# Patient Record
Sex: Male | Born: 1975 | Race: White | Hispanic: No | Marital: Single | Smoking: Never smoker
Health system: Southern US, Community
[De-identification: ages and names within clinical notes are randomized; demographics above are authoritative.]

## PROBLEM LIST (undated history)

## (undated) DIAGNOSIS — I10 Essential (primary) hypertension: Secondary | ICD-10-CM

## (undated) DIAGNOSIS — K5792 Diverticulitis of intestine, part unspecified, without perforation or abscess without bleeding: Secondary | ICD-10-CM

## (undated) DIAGNOSIS — I471 Supraventricular tachycardia: Secondary | ICD-10-CM

## (undated) HISTORY — DX: Supraventricular tachycardia: I47.1

## (undated) HISTORY — PX: ABDOMINAL SURGERY: SHX537

---

## 2011-04-17 DIAGNOSIS — I471 Supraventricular tachycardia, unspecified: Secondary | ICD-10-CM

## 2011-04-17 HISTORY — DX: Supraventricular tachycardia: I47.1

## 2011-04-17 HISTORY — DX: Supraventricular tachycardia, unspecified: I47.10

## 2013-12-16 ENCOUNTER — Emergency Department (HOSPITAL_COMMUNITY)
Admission: EM | Admit: 2013-12-16 | Discharge: 2013-12-16 | Disposition: A | Discharge status: Home / Self Care | Payer: Managed Care, Other (non HMO) | Attending: Emergency Medicine | Admitting: Emergency Medicine

## 2013-12-16 ENCOUNTER — Emergency Department (HOSPITAL_COMMUNITY): Payer: Managed Care, Other (non HMO)

## 2013-12-16 ENCOUNTER — Encounter (HOSPITAL_COMMUNITY): Payer: Self-pay | Admitting: Emergency Medicine

## 2013-12-16 DIAGNOSIS — Z79899 Other long term (current) drug therapy: Secondary | ICD-10-CM | POA: Insufficient documentation

## 2013-12-16 DIAGNOSIS — R109 Unspecified abdominal pain: Secondary | ICD-10-CM | POA: Insufficient documentation

## 2013-12-16 DIAGNOSIS — K2990 Gastroduodenitis, unspecified, without bleeding: Principal | ICD-10-CM

## 2013-12-16 DIAGNOSIS — K2970 Gastritis, unspecified, without bleeding: Secondary | ICD-10-CM | POA: Diagnosis present

## 2013-12-16 DIAGNOSIS — Z7982 Long term (current) use of aspirin: Secondary | ICD-10-CM | POA: Diagnosis not present

## 2013-12-16 HISTORY — DX: Diverticulitis of intestine, part unspecified, without perforation or abscess without bleeding: K57.92

## 2013-12-16 LAB — URINALYSIS, ROUTINE W REFLEX MICROSCOPIC
Bilirubin Urine: NEGATIVE
GLUCOSE, UA: NEGATIVE mg/dL
HGB URINE DIPSTICK: NEGATIVE
KETONES UR: NEGATIVE mg/dL
LEUKOCYTES UA: NEGATIVE
Nitrite: NEGATIVE
PH: 6.5 (ref 5.0–8.0)
Protein, ur: NEGATIVE mg/dL
Specific Gravity, Urine: 1.013 (ref 1.005–1.030)
Urobilinogen, UA: 0.2 mg/dL (ref 0.0–1.0)

## 2013-12-16 LAB — CBC WITH DIFFERENTIAL/PLATELET
Basophils Absolute: 0.1 10*3/uL (ref 0.0–0.1)
Basophils Relative: 1 % (ref 0–1)
EOS PCT: 3 % (ref 0–5)
Eosinophils Absolute: 0.2 10*3/uL (ref 0.0–0.7)
HCT: 44.3 % (ref 39.0–52.0)
Hemoglobin: 15.4 g/dL (ref 13.0–17.0)
LYMPHS ABS: 1.9 10*3/uL (ref 0.7–4.0)
LYMPHS PCT: 26 % (ref 12–46)
MCH: 31.1 pg (ref 26.0–34.0)
MCHC: 34.8 g/dL (ref 30.0–36.0)
MCV: 89.5 fL (ref 78.0–100.0)
Monocytes Absolute: 0.7 10*3/uL (ref 0.1–1.0)
Monocytes Relative: 9 % (ref 3–12)
NEUTROS ABS: 4.4 10*3/uL (ref 1.7–7.7)
Neutrophils Relative %: 61 % (ref 43–77)
PLATELETS: 399 10*3/uL (ref 150–400)
RBC: 4.95 MIL/uL (ref 4.22–5.81)
RDW: 12.3 % (ref 11.5–15.5)
WBC: 7.3 10*3/uL (ref 4.0–10.5)

## 2013-12-16 LAB — COMPREHENSIVE METABOLIC PANEL
ALK PHOS: 43 U/L (ref 39–117)
ALT: 40 U/L (ref 0–53)
AST: 27 U/L (ref 0–37)
Albumin: 4.3 g/dL (ref 3.5–5.2)
Anion gap: 14 (ref 5–15)
BUN: 14 mg/dL (ref 6–23)
CALCIUM: 10.3 mg/dL (ref 8.4–10.5)
CHLORIDE: 101 meq/L (ref 96–112)
CO2: 23 meq/L (ref 19–32)
Creatinine, Ser: 0.87 mg/dL (ref 0.50–1.35)
GFR calc Af Amer: >90 mL/min (ref >90)
Glucose, Bld: 99 mg/dL (ref 70–99)
POTASSIUM: 4.5 meq/L (ref 3.7–5.3)
SODIUM: 138 meq/L (ref 137–147)
Total Bilirubin: 0.8 mg/dL (ref 0.3–1.2)
Total Protein: 8 g/dL (ref 6.0–8.3)

## 2013-12-16 LAB — LIPASE, BLOOD: Lipase: 26 U/L (ref 11–59)

## 2013-12-16 MED ORDER — ONDANSETRON 4 MG PO TBDP
8.0000 mg | ORAL_TABLET | Freq: Once | ORAL | Status: AC
  Administered 2013-12-16: 8 mg via ORAL
  Filled 2013-12-16: qty 2

## 2013-12-16 MED ORDER — OMEPRAZOLE 20 MG PO CPDR: 20.0000 mg | DELAYED_RELEASE_CAPSULE | Freq: Every day | ORAL | Status: DC

## 2013-12-16 NOTE — ED Notes (Signed)
Patient transported to Ultrasound 

## 2013-12-16 NOTE — ED Provider Notes (Signed)
38 year-old male presents with intermittent right upper quadrant pain over the last several days, seems to be worse after eating, seems to be worse at night, recent trip town, recent diarrheal illness lasting one week. On exam the patient has right upper quadrant tenderness but no Murphy sign, no other abdominal tenderness or any concern. No surgical signs, normal heart and lung exam, no peripheral edema. Proceed with right upper quadrant pain workup including ultrasound and labs. Anticipate discharge if negative. The patient has been taking a proton pump inhibitor without improvement. He denies use of anti-inflammatory medications until today when he took some ibuprofen.  [place] neg for acute findings.  Results for orders placed during the hospital encounter of 12/16/13  CBC WITH DIFFERENTIAL      Result Value Ref Range   WBC 7.3  4.0 - 10.5 K/uL   RBC 4.95  4.22 - 5.81 MIL/uL   Hemoglobin 15.4  13.0 - 17.0 g/dL   HCT 44.3  39.0 - 52.0 %   MCV 89.5  78.0 - 100.0 fL   MCH 31.1  26.0 - 34.0 pg   MCHC 34.8  30.0 - 36.0 g/dL   RDW 12.3  11.5 - 15.5 %   Platelets 399  150 - 400 K/uL   Neutrophils Relative % 61  43 - 77 %   Neutro Abs 4.4  1.7 - 7.7 K/uL   Lymphocytes Relative 26  12 - 46 %   Lymphs Abs 1.9  0.7 - 4.0 K/uL   Monocytes Relative 9  3 - 12 %   Monocytes Absolute 0.7  0.1 - 1.0 K/uL   Eosinophils Relative 3  0 - 5 %   Eosinophils Absolute 0.2  0.0 - 0.7 K/uL   Basophils Relative 1  0 - 1 %   Basophils Absolute 0.1  0.0 - 0.1 K/uL  COMPREHENSIVE METABOLIC PANEL      Result Value Ref Range   Sodium 138  137 - 147 mEq/L   Potassium 4.5  3.7 - 5.3 mEq/L   Chloride 101  96 - 112 mEq/L   CO2 23  19 - 32 mEq/L   Glucose, Bld 99  70 - 99 mg/dL   BUN 14  6 - 23 mg/dL   Creatinine, Ser 0.87  0.50 - 1.35 mg/dL   Calcium 10.3  8.4 - 10.5 mg/dL   Total Protein 8.0  6.0 - 8.3 g/dL   Albumin 4.3  3.5 - 5.2 g/dL   AST 27  0 - 37 U/L   ALT 40  0 - 53 U/L   Alkaline Phosphatase 43  39 - 117  U/L   Total Bilirubin 0.8  0.3 - 1.2 mg/dL   GFR calc non Af Amer >90  >90 mL/min   GFR calc Af Amer >90  >90 mL/min   Anion gap 14  5 - 15  LIPASE, BLOOD      Result Value Ref Range   Lipase 26  11 - 59 U/L  URINALYSIS, ROUTINE W REFLEX MICROSCOPIC      Result Value Ref Range   Color, Urine YELLOW  YELLOW   APPearance CLEAR  CLEAR   Specific Gravity, Urine 1.013  1.005 - 1.030   pH 6.5  5.0 - 8.0   Glucose, UA NEGATIVE  NEGATIVE mg/dL   Hgb urine dipstick NEGATIVE  NEGATIVE   Bilirubin Urine NEGATIVE  NEGATIVE   Ketones, ur NEGATIVE  NEGATIVE mg/dL   Protein, ur NEGATIVE  NEGATIVE mg/dL  Urobilinogen, UA 0.2  0.0 - 1.0 mg/dL   Nitrite NEGATIVE  NEGATIVE   Leukocytes, UA NEGATIVE  NEGATIVE  H. PYLORI ANTIBODY, IGG      Result Value Ref Range   H Pylori IgG 0.85     US Abdomen Limited  12/16/2013   CLINICAL DATA:  Abdominal pain, right upper quadrant  EXAM: US ABDOMEN LIMITED - RIGHT UPPER QUADRANT  COMPARISON:  None.  FINDINGS: Gallbladder:  No gallstones or wall thickening visualized. No sonographic Murphy sign noted.  Common bile duct:  Diameter: 4 mm  Liver:  Dense liver with poor acoustic transmission, limiting assessment of the deep liver and deep biliary tree. Antegrade flow in the imaged portal venous system. No focal abnormality.  IMPRESSION: 1. Negative gallbladder. 2. Hepatic steatosis.   Electronically Signed   By: Jorje Guild M.D.   On: 12/16/2013 19:39    Pt will be started on PPI, stable for d/c.  Meds given in ED:  Medications  ondansetron (ZOFRAN-ODT) disintegrating tablet 8 mg (8 mg Oral Given 12/16/13 1712)    Discharge Medication List as of 12/16/2013  8:11 PM    START taking these medications   Details  omeprazole (PRILOSEC) 20 MG capsule Take 1 capsule (20 mg total) by mouth daily., Starting 12/16/2013, Until Discontinued, Print          I saw and evaluated the patient, reviewed the resident's note and I agree with the findings and  plan.    Johnna Acosta, MD 12/17/13 [phone]

## 2013-12-16 NOTE — ED Provider Notes (Signed)
CSN: [id number]     Arrival date & time 12/16/13  1700 History   First MD Initiated Contact with Patient 12/16/13 1842     Chief Complaint  Patient presents with  . Abdominal Pain     (Consider location/radiation/quality/duration/timing/severity/associated sxs/prior Treatment) Patient is a 38 y.o. male presenting with abdominal pain. The history is provided by the patient.  Abdominal Pain Pain location:  RUQ Pain quality: gnawing   Pain radiates to:  Epigastric region Pain severity:  Moderate Onset quality:  Gradual Duration:  4 days Timing:  Intermittent Progression:  Worsening Chronicity:  New Context comment:  Patient was recently in [place] with friends where he was dirnking EtOH moderately about 4-5 days ago Relieved by:  Nothing Exacerbated by: noted 1-2 hours after meals. Ineffective treatments: reports having reflux in the past 2 months but this is different, and has tried Prilosec for these symptoms. Associated symptoms: nausea   Associated symptoms: no anorexia, no belching, no chest pain, no chills, no constipation, no cough, no diarrhea, no dysuria, no fatigue, no fever, no flatus, no hematemesis, no melena, no shortness of breath, no sore throat, no vaginal bleeding and no vaginal discharge   Nausea:    Severity:  Moderate   Onset quality:  Gradual   Duration:  2 days   Timing:  Intermittent (with pain) Risk factors: no aspirin use, not elderly, has not had multiple surgeries, no NSAID use, not obese, not pregnant and no recent hospitalization     Past Medical History  Diagnosis Date  . Diverticulitis    History reviewed. No pertinent past surgical history. No family history on file. History  Substance Use Topics  . Smoking status: Never Smoker   . Smokeless tobacco: Not on file  . Alcohol Use: Yes    Review of Systems  Constitutional: Negative for fever, chills and fatigue.  HENT: Negative for sore throat.   Respiratory: Negative for cough and shortness  of breath.   Cardiovascular: Negative for chest pain.  Gastrointestinal: Positive for nausea and abdominal pain. Negative for diarrhea, constipation, melena, anorexia, flatus and hematemesis.  Genitourinary: Negative for dysuria, vaginal bleeding and vaginal discharge.  All other systems reviewed and are negative.     Allergies  Sulfa antibiotics  Home Medications   Prior to Admission medications   Medication Sig Start Date End Date Taking? Authorizing Provider  aspirin 81 MG tablet Take 81 mg by mouth daily.   Yes Historical Provider, MD  calcium carbonate (TUMS - DOSED IN MG ELEMENTAL CALCIUM) 500 MG chewable tablet Chew 1 tablet by mouth daily as needed for indigestion or heartburn.   Yes Historical Provider, MD  docusate sodium (COLACE) 100 MG capsule Take 100 mg by mouth 2 (two) times daily.   Yes Historical Provider, MD  lisinopril (PRINIVIL,ZESTRIL) 30 MG tablet Take 30 mg by mouth daily.   Yes Historical Provider, MD  metoprolol (LOPRESSOR) 100 MG tablet Take 100 mg by mouth 2 (two) times daily.   Yes Historical Provider, MD  Multiple Vitamin (MULTIVITAMIN WITH MINERALS) TABS tablet Take 1 tablet by mouth daily.   Yes Historical Provider, MD  simethicone (MYLICON) 80 MG chewable tablet Chew 80 mg by mouth every 6 (six) hours as needed for flatulence.   Yes Historical Provider, MD  omeprazole (PRILOSEC) 20 MG capsule Take 1 capsule (20 mg total) by mouth daily. 12/16/13   Kelby Aline, MD   BP 131/77  Pulse 73  Temp(Src) 98.2 F (36.8 C) (Oral)  Resp 14  Ht 6' (1.829 m)  Wt 245 lb (111.131 kg)  BMI 33.22 kg/m2  SpO2 100% Physical Exam  Nursing note and vitals reviewed. Constitutional: He is oriented to person, place, and time. He appears well-developed and well-nourished. No distress.  HENT:  Head: Normocephalic and atraumatic.  Eyes: Conjunctivae and EOM are normal. Right eye exhibits no discharge. Left eye exhibits no discharge.  Neck: Normal range of motion. Neck  supple. No tracheal deviation present.  Cardiovascular: Normal rate, regular rhythm and normal heart sounds.  Exam reveals no friction rub.   No murmur heard. Pulmonary/Chest: Effort normal and breath sounds normal. No stridor. No respiratory distress. He has no wheezes. He has no rales. He exhibits no tenderness.  Abdominal: Soft. He exhibits no distension. There is no splenomegaly or hepatomegaly. There is tenderness in the right upper quadrant and epigastric area. There is positive Murphy's sign. There is no rebound and no guarding.  Neurological: He is alert and oriented to person, place, and time.  Skin: Skin is warm.  Psychiatric: He has a normal mood and affect.    ED Course  Procedures (including critical care time) Labs Review Labs Reviewed  CBC WITH DIFFERENTIAL  COMPREHENSIVE METABOLIC PANEL  LIPASE, BLOOD  URINALYSIS, ROUTINE W REFLEX MICROSCOPIC  H. PYLORI ANTIBODY, IGG    Imaging Review US Abdomen Limited  12/16/2013   CLINICAL DATA:  Abdominal pain, right upper quadrant  EXAM: US ABDOMEN LIMITED - RIGHT UPPER QUADRANT  COMPARISON:  None.  FINDINGS: Gallbladder:  No gallstones or wall thickening visualized. No sonographic Murphy sign noted.  Common bile duct:  Diameter: 4 mm  Liver:  Dense liver with poor acoustic transmission, limiting assessment of the deep liver and deep biliary tree. Antegrade flow in the imaged portal venous system. No focal abnormality.  IMPRESSION: 1. Negative gallbladder. 2. Hepatic steatosis.   Electronically Signed   By: Jorje Guild M.D.   On: 12/16/2013 19:39     EKG Interpretation None      MDM   Final diagnoses:  Gastritis    Pt with a history of perforated diverticulitis s/p partial colectomy 3 years ago presents with RUQ abdominal pain. + nausea, but no vomiting. No diarrhea, change in stool color. He did have diarrhea about a weeks ago, which has resolved. No dysuria. Has a remote history of kidney stones but this is not similar.  Recently was drinking alcohol socially. AFVSS. NAD. RUQ ttp, but no murphy sign. No peritonitis. No CVA ttp. Lipase neg - doubt acute pancreatitis. LFTs wnls -doubt acute hepatitis. RUQ neg for acute cholecystitis or cholelithiasis. Likely GERD. H pylori sent off. Start omeprazole 20 mg QD, and will need to follow up with PCP regarding H pylori results as he will need to be treated if +. He understands and will fu as noted. Refrain from EtOH use and NSAID use until cleared by his primary doctor. Strong return precautions given for worsening symptoms or any other alarming or concerning symptoms or issues. The patient was in agreement with the treatment plan and I answered all of their questions. The patient was stable for dc. At dc, the patient ambulated without difficulty, was moving all four extremities, symptoms improved, NAD. and AOx4 Care discussed with my attending, Dr. Noemi Chapel. If performed and available, imaging studies and labs reviewed.     Kelby Aline, MD 12/16/13 2021

## 2013-12-16 NOTE — ED Notes (Signed)
Pt. Stated, "I'm okay right now, I don't need anything for pain."

## 2013-12-16 NOTE — ED Notes (Signed)
The pt is c/o generalized abd pain for 3-4 days with sl nausea.  Hx of diverticulitis

## 2013-12-16 NOTE — Discharge Instructions (Signed)

## 2013-12-17 LAB — H. PYLORI ANTIBODY, IGG: H Pylori IgG: 0.85 {ISR}

## 2013-12-17 NOTE — ED Provider Notes (Signed)
I saw and evaluated the patient, reviewed the resident's note and I agree with the findings and plan.  Please see my separate note regarding my evaluation of the patient.  Clinical Impression:  RUQ abdominal pain  Johnna Acosta, MD 12/17/13 1428

## 2015-05-10 ENCOUNTER — Ambulatory Visit (INDEPENDENT_AMBULATORY_CARE_PROVIDER_SITE_OTHER): Payer: Managed Care, Other (non HMO) | Admitting: Cardiology

## 2015-05-10 ENCOUNTER — Encounter: Payer: Self-pay | Admitting: Cardiology

## 2015-05-10 VITALS — BP 110/76 | HR 70 | Ht 73.0 in | Wt 241.4 lb

## 2015-05-10 DIAGNOSIS — I471 Supraventricular tachycardia, unspecified: Secondary | ICD-10-CM | POA: Insufficient documentation

## 2015-05-10 DIAGNOSIS — I10 Essential (primary) hypertension: Secondary | ICD-10-CM

## 2015-05-10 NOTE — Progress Notes (Signed)
Cardiology Office Note   Date:  05/10/2015   ID:  Ryan Williamson, DOB February 28, 1976, MRN [id number]  PCP:  No PCP Per Patient  Cardiologist:   Minus Breeding, MD   Chief Complaint  Patient presents with  . SVT      History of Present Illness: Ryan Williamson is a 40 y.o. male who presents for evaluation of SVT. He is moving here from [place]. I was able to review records online from the [facility]. He had a history of SVT in 2013. 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 He was treated with beta blockers and there was a discussion of ablation. However, he said that his symptoms resolved almost completely with beta blocker. He's moving down here and wants to reestablish care. He is also on medications for blood pressure control. He's wanting to start to get into more of an exercise regimen and started doing a lot more walking up to 5 miles per day. The patient denies any new symptoms such as chest discomfort, neck or arm discomfort. There has been no new shortness of breath, PND or orthopnea. There have been no reported palpitations, presyncope or syncope.  Past Medical History  Diagnosis Date  . Diverticulitis     No past surgical history on file.   Current Outpatient Prescriptions  Medication Sig Dispense Refill  . aspirin 81 MG tablet Take 81 mg by mouth daily.    . calcium carbonate (TUMS - DOSED IN MG ELEMENTAL CALCIUM) 500 MG chewable tablet Chew 1 tablet by mouth daily as needed for indigestion or heartburn.    . docusate sodium (COLACE) 100 MG capsule Take 100 mg by mouth 2 (two) times daily.    Marland Kitchen lisinopril (PRINIVIL,ZESTRIL) 30 MG tablet Take 30 mg by mouth daily.    . metoprolol (LOPRESSOR) 100 MG tablet Take 100 mg by mouth 2 (two) times daily.    . Multiple Vitamin (MULTIVITAMIN WITH MINERALS) TABS tablet Take 1 tablet by mouth daily.    Marland Kitchen omeprazole (PRILOSEC) 20 MG capsule Take 1 capsule (20 mg total) by mouth daily. (Patient taking differently:  Take 20 mg by mouth as needed. ) 30 capsule 0  . omeprazole (PRILOSEC) 20 MG capsule Take 20 mg by mouth as needed.     No current facility-administered medications for this visit.    Allergies:   Sulfa antibiotics    Social History:  The patient  reports that he has never smoked. He does not have any smokeless tobacco history on file. He reports that he drinks alcohol.   Family History:  The patient's HTN in his mother   ROS:  Please see the history of present illness.   Otherwise, review of systems are positive for none.   All other systems are reviewed and negative.    PHYSICAL EXAM: VS:  BP 110/76 mmHg  Pulse 70  Ht 6\' 1"  (1.854 m)  Wt 241 lb 6.4 oz (109.498 kg)  BMI 31.86 kg/m2 , BMI Body mass index is 31.86 kg/(m^2). GENERAL:  Well appearing HEENT:  Pupils equal round and reactive, fundi not visualized, oral mucosa unremarkable NECK:  No jugular venous distention, waveform within normal limits, carotid upstroke brisk and symmetric, no bruits, no thyromegaly LYMPHATICS:  No cervical, inguinal adenopathy LUNGS:  Clear to auscultation bilaterally BACK:  No CVA tenderness CHEST:  Unremarkable HEART:  PMI not displaced or sustained,S1 and S2 within normal limits, no S3, no S4, no clicks, no rubs, no murmurs ABD:  Flat, positive bowel sounds normal in frequency in pitch, no bruits, no rebound, no guarding, no midline pulsatile mass, no hepatomegaly, no splenomegaly EXT:  2 plus pulses throughout, no edema, no cyanosis no clubbing SKIN:  No rashes no nodules NEURO:  Cranial nerves II through XII grossly intact, motor grossly intact throughout PSYCH:  Cognitively intact, oriented to person place and time    EKG:  EKG is ordered today. The ekg ordered today demonstrates sinus rhythm, rate 74, axis within normal limits, intervals within normal limits, no acute ST-T wave changes.   Recent Labs: No results found for requested labs within last 365 days.    Lipid Panel No  results found for: CHOL, TRIG, HDL, CHOLHDL, VLDL, LDLCALC, LDLDIRECT    Wt Readings from Last 3 Encounters:  05/10/15 241 lb 6.4 oz (109.498 kg)  12/16/13 245 lb (111.131 kg)      Other studies Reviewed: Additional studies/ records that were reviewed today include: [facility] records.. Review of the above records demonstrates:  Please see elsewhere in the note.     ASSESSMENT AND PLAN:  SVT:  He is well controlled on beta blockers. He tried to switch to once daily instead of twice daily Toprol-XL. He had a few more palpitations so he can continue the regimen as currently listed. I don't see any indication for ablation or change in therapy as he is well controlled and has no problems taking the beta blocker.  WEIGHT/PRIMARY PREVENTION:  I did discuss with him the fact that risks benefits would not suggest any need for him to take aspirin. We talked at great length about diet and exercise for weight control and he was given very specific suggestions for intervention. He is motivated.  HTN:  His blood pressure is well controlled. As he loses weight and improves his overall healthy eating and active living he will likely be overcome down the lisinopril and we discussed this.   Current medicines are reviewed at length with the patient today.  The patient does not have concerns regarding medicines.  The following changes have been made:  no change  Labs/ tests ordered today include: None  No orders of the defined types were placed in this encounter.     Disposition:   FU with me in one year.     Signed, Minus Breeding, MD  05/10/2015 6:27 PM    [place]

## 2015-05-23 ENCOUNTER — Other Ambulatory Visit: Payer: Self-pay | Admitting: Family Medicine

## 2015-05-23 DIAGNOSIS — R1032 Left lower quadrant pain: Secondary | ICD-10-CM

## 2015-05-27 ENCOUNTER — Ambulatory Visit
Admission: RE | Admit: 2015-05-27 | Discharge: 2015-05-27 | Disposition: A | Discharge status: Home / Self Care | Payer: Managed Care, Other (non HMO) | Source: Ambulatory Visit | Attending: Family Medicine | Admitting: Family Medicine

## 2015-05-27 ENCOUNTER — Other Ambulatory Visit: Payer: Self-pay | Admitting: Family Medicine

## 2015-05-27 ENCOUNTER — Encounter (INDEPENDENT_AMBULATORY_CARE_PROVIDER_SITE_OTHER): Payer: Self-pay

## 2015-05-27 DIAGNOSIS — R1032 Left lower quadrant pain: Secondary | ICD-10-CM

## 2016-01-04 ENCOUNTER — Ambulatory Visit: Payer: Self-pay | Admitting: General Surgery

## 2016-01-04 NOTE — H&P (Signed)
Ryan Williamson 01/04/2016 11:22 AM Location: [facility] Patient #: [id number] DOB: 1976/03/29 Married / Language: [name] / Race: White Male  History of Present Illness [name] MD; 01/04/2016 11:57 AM) The patient is a 40 year old male.   Note:He presents today for preoperative visit regarding left inguinal hernia repair with mesh and removal of 2 small cysts on his scrotum. He is been managing a farm. He feels he will be able to have the surgery in November.  Allergies Elbert Ewings, CMA; 01/04/2016 11:22 AM) Sulfabenzamide *CHEMICALS* Hives.  Medication History Elbert Ewings, CMA; 01/04/2016 11:22 AM) Aspirin (81MG  Tablet, Oral daily) Active. Lisinopril (30MG  Tablet, Oral) Active. Metoprolol Succinate ER (100MG  Tablet ER 24HR, Oral) Active. Medications Reconciled    Vitals Elbert Ewings CMA; 01/04/2016 11:22 AM) 01/04/2016 11:22 AM Weight: 238 lb Height: 72in Body Surface Area: 2.29 m Body Mass Index: 32.28 kg/m  Temp.: 98.56F(Temporal)  Pulse: 72 (Regular)  BP: 128/82 (Sitting, Left Arm, Standard)      Physical Exam [name] MD; 01/04/2016 11:58 AM)  The physical exam findings are as follows: Note:General-overweight male in no acute distress.  GU-reducible left inguinal bulge. Small cystic lesion on left and right hemiscrotal area.    Assessment & Plan [name] MD; 01/04/2016 11:57 AM)  REDUCIBLE LEFT INGUINAL HERNIA (K40.90) Impression: Unchanged.  Plan: Schedule left inguinal hernia. Mesh and removal of small cysts and scrotum. I have explained the procedure, risks, and aftercare of inguinal hernia repair. Risks include but are not limited to bleeding, infection, wound problems, anesthesia, recurrence, bladder or intestine injury, urinary retention, testicular dysfunction, chronic pain, mesh problems. He seems to understand and agrees to proceed. e went over the procedure and risks again.  Jackolyn Confer,  MD

## 2016-05-01 ENCOUNTER — Ambulatory Visit (INDEPENDENT_AMBULATORY_CARE_PROVIDER_SITE_OTHER): Payer: BLUE CROSS/BLUE SHIELD | Admitting: Physician Assistant

## 2016-05-01 ENCOUNTER — Encounter: Payer: Self-pay | Admitting: Physician Assistant

## 2016-05-01 ENCOUNTER — Telehealth: Payer: Self-pay | Admitting: Cardiology

## 2016-05-01 VITALS — BP 116/75 | HR 75 | Ht 73.0 in | Wt 241.4 lb

## 2016-05-01 DIAGNOSIS — I471 Supraventricular tachycardia: Secondary | ICD-10-CM

## 2016-05-01 DIAGNOSIS — I491 Atrial premature depolarization: Secondary | ICD-10-CM | POA: Diagnosis not present

## 2016-05-01 NOTE — Progress Notes (Signed)
Cardiology Office Note   Date:  05/01/2016   ID:  Ryan Williamson, DOB Feb 03, 1976, MRN [id number]  PCP:  No PCP Per Patient  Cardiologist:  Dr. Percival Spanish 01/08/2016  Rosaria Ferries, PA-C   Chief Complaint  Patient presents with  . Palpitations    History of Present Illness: Ryan Williamson is a 41 y.o. male with a history of SVT  Ryan Williamson presents for cardiology evaluation.  Until today, he was doing fine. His wife is [redacted] weeks pregnant.  He had [place] yesterday, [place] food has been a trigger in the past. He has also used Afrin 12 hr nasal spray for sinus congestion. He has had problems with pseudafed in the past, but did not use it recently.   Today, when he woke up, he was having palpitations. He took his usual Toprol XL 100 mg at 6 am. He was having frequent palps so took Lopressor 100 mg at 8 am. The palpitations have continued to happen, every minute or 2, all day long. The Lopressor did help the frequency, but they have not stopped all day.   The palps give him a sensation in his neck, a pause and then a harder beat. They do not give him chest pain or SOB. He has not had long runs.   He is concerned that he will be sick and not be able to help care for his son. He is worried that the ectopy is harmful to him. He is under stress because of everything going on with the pregnancy.    Past Medical History:  Diagnosis Date  . Diverticulitis   . SVT (supraventricular tachycardia) ([place]) 2013   Diagnosed at the [facility]    No past surgical history on file.  Current Outpatient Prescriptions  Medication Sig Dispense Refill  . aspirin 81 MG tablet Take 81 mg by mouth daily.    . calcium carbonate (TUMS - DOSED IN MG ELEMENTAL CALCIUM) 500 MG chewable tablet Chew 1 tablet by mouth daily as needed for indigestion or heartburn.    . docusate sodium (COLACE) 100 MG capsule Take 100 mg by mouth 2 (two) times daily.    Marland Kitchen lisinopril (PRINIVIL,ZESTRIL) 30 MG  tablet Take 30 mg by mouth daily.    . metoprolol (LOPRESSOR) 100 MG tablet Take 100 mg by mouth 2 (two) times daily as needed (palpatitions).     . metoprolol succinate (TOPROL-XL) 100 MG 24 hr tablet Take 1 mg by mouth 2 (two) times daily.    . Multiple Vitamin (MULTIVITAMIN WITH MINERALS) TABS tablet Take 1 tablet by mouth daily.    Marland Kitchen omeprazole (PRILOSEC) 20 MG capsule Take 20 mg by mouth as needed.     No current facility-administered medications for this visit.     Allergies:   Sulfa antibiotics    Social History:  The patient  reports that he has never smoked. He does not have any smokeless tobacco history on file. He reports that he drinks alcohol.   Family History:  The patient's family history is not on file.    ROS:  Please see the history of present illness. All other systems are reviewed and negative.    PHYSICAL EXAM: VS:  BP 116/75   Pulse 75   Ht 6\' 1"  (1.854 m)   Wt 241 lb 6.4 oz (109.5 kg)   BMI 31.85 kg/m  , BMI Body mass index is 31.85 kg/m. GEN: Well nourished, well developed, male in no acute  distress  HEENT: normal for age  Neck: no JVD, no carotid bruit, no masses Cardiac: RRR; no murmur, no rubs, or gallops Respiratory:  clear to auscultation bilaterally, normal work of breathing GI: soft, nontender, nondistended, + BS MS: no deformity or atrophy; no edema; distal pulses are 2+ in all 4 extremities   Skin: warm and dry, no rash Neuro:  Strength and sensation are intact Psych: euthymic mood, full affect   EKG:  EKG is ordered today. The ekg ordered today demonstrates SR, frequent PACs, 2-3/minute.   Recent Labs: No results found for requested labs within last 8760 hours.    Lipid Panel No results found for: CHOL, TRIG, HDL, CHOLHDL, VLDL, LDLCALC, LDLDIRECT   Wt Readings from Last 3 Encounters:  05/01/16 241 lb 6.4 oz (109.5 kg)  05/10/15 241 lb 6.4 oz (109.5 kg)  12/16/13 245 lb (111.1 kg)     Other studies Reviewed: Additional  studies/ records that were reviewed today include: office notes.  ASSESSMENT AND PLAN:  1.  SVT: Pt is not having any of that now. He is to continue the Toprol XL.  2. PACs: They are frequent and concern him, but are not causing true symptoms. If they do not get better in a day or so, we can start Cardizem CD 120 mg qd in addition to the Toprol XL. He would have to cut the prn Lopressor in half and be very careful about using it. If the PACs resolve, no med change needed.    Current medicines are reviewed at length with the patient today.  The patient does not have concerns regarding medicines.  The following changes have been made:  no change at this time. Add Cardizem CD 120 mg qd prn  Labs/ tests ordered today include:   No orders of the defined types were placed in this encounter.    Disposition:   FU with Dr Percival Spanish  Signed, Rosaria Ferries, PA-C  05/01/2016 2:57 PM    [facility] Phone: [phone]; Fax: [phone]  This note was written with the assistance of speech recognition software. Please excuse any transcriptional errors.

## 2016-05-01 NOTE — Telephone Encounter (Signed)
Acknowledged.

## 2016-05-01 NOTE — Telephone Encounter (Signed)
New Message  Pt is scheduled to see PA/APP-Rhonda Barrett today @ 230 pm.

## 2016-05-01 NOTE — Patient Instructions (Signed)
Your physician recommends that you continue on your current medications as directed. Please refer to the Current Medication list given to you today.  Please contact our office if you symptoms persist  Your physician wants you to follow-up in: [place] with Dr. Percival Spanish. You will receive a reminder letter in the mail two months in advance. If you don't receive a letter, please call our office to schedule the follow-up appointment.

## 2016-11-12 ENCOUNTER — Encounter: Payer: Self-pay | Admitting: Podiatry

## 2016-11-12 ENCOUNTER — Ambulatory Visit (INDEPENDENT_AMBULATORY_CARE_PROVIDER_SITE_OTHER): Payer: BLUE CROSS/BLUE SHIELD | Admitting: Podiatry

## 2016-11-12 VITALS — BP 135/78 | HR 90

## 2016-11-12 DIAGNOSIS — B351 Tinea unguium: Secondary | ICD-10-CM | POA: Diagnosis not present

## 2016-11-12 DIAGNOSIS — L603 Nail dystrophy: Secondary | ICD-10-CM | POA: Diagnosis not present

## 2016-11-12 NOTE — Progress Notes (Signed)
   Subjective:    Patient ID: Giannis Corpuz, male    DOB: 27-May-1975, 41 y.o.   MRN: [id number]  HPI 41 year old male presents the office today for concerns of her left toenail injury. He states that he dropped a piece of firewood on his left toenail back in March or April. He states that as the nail has grown out is starting somewhat ingrown. He states that when he first had the injury the nail to crack and started to become somewhat brittle. Denies any swelling or redness or any drainage. He has some occasional discomfort in the toenail. He has a history of ingrown toenails right big toe that had been taken out.   Review of Systems  All other systems reviewed and are negative.      Objective:   Physical Exam General: AAO x3, NAD  Dermatological: To the left hallux toenail is incurvation of both the medial and lateral aspects. There does appear to be horizontal ridge within the nail on the distal one third. There is no edema, erythema, drainage or pus. There is mild discoloration with yellow discoloration as also to be the toenail.  There are no open sores, no preulcerative lesions, no rash or signs of infection present.  Vascular: Dorsalis Pedis artery and Posterior Tibial artery pedal pulses are 2/4 bilateral with immedate capillary fill time. There is no pain with calf compression, swelling, warmth, erythema.   Neruologic: Grossly intact via light touch bilateral.  Patellar and Achilles deep tendon reflexes 2+ bilateral. No Babinski or clonus noted bilateral.   Musculoskeletal: No gross boney pedal deformities bilateral. No pain, crepitus, or limitation noted with foot and ankle range of motion bilateral. Muscular strength 5/5 in all groups tested bilateral.  Gait: Unassisted, Nonantalgic.      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 He does not want have this done today therefore I did to breathe symptomatic portion ingrown toenail the any complications. I did send the nail also for cultures/pathology to [facility] labs. Discussed that over the nail will grow out uneventfully but is a chance he may lose part or all the toenail. Continue to keep it straight across. Monitor for any clinical signs or symptoms of infection and directed to call the office immediately should any occur or go to the ER.  Celesta Gentile, DPM

## 2016-11-29 ENCOUNTER — Telehealth: Payer: Self-pay

## 2016-11-29 MED ORDER — UREA 40 % EX CREA: 1.0000 g | TOPICAL_CREAM | Freq: Two times a day (BID) | CUTANEOUS | 3 refills | Status: DC

## 2016-11-29 NOTE — Telephone Encounter (Signed)
Spoke with patient about negative nail culture results, advised him of Rx for Urea.  Sent Rx to [facility]

## 2016-11-29 NOTE — Telephone Encounter (Signed)
-----   Message from Trula Slade, DPM sent at 11/29/2016  7:15 AM EDT ----- Negative for fungus. I would do a urea cream for the nail. Can either get it from the office or please order through [facility]. Please let her know. Thanks.

## 2016-12-24 ENCOUNTER — Ambulatory Visit: Payer: BLUE CROSS/BLUE SHIELD | Admitting: Podiatry

## 2017-01-03 ENCOUNTER — Ambulatory Visit (INDEPENDENT_AMBULATORY_CARE_PROVIDER_SITE_OTHER): Payer: BLUE CROSS/BLUE SHIELD | Admitting: Podiatry

## 2017-01-03 ENCOUNTER — Encounter: Payer: Self-pay | Admitting: Podiatry

## 2017-01-03 DIAGNOSIS — L600 Ingrowing nail: Secondary | ICD-10-CM | POA: Diagnosis not present

## 2017-01-03 DIAGNOSIS — L603 Nail dystrophy: Secondary | ICD-10-CM

## 2017-01-04 NOTE — Progress Notes (Signed)
Subjective: Ryan Williamson presents the office they for follow-up evaluation of his left big toenail onychodystrophy, ingrown toenail. States the areas doing much better since I last saw him. He is been using the urea cream intermittently been on a continual basis. He does with a stent top of the toe in order to help the graft better and help prevent any future issues. He has no new concerns. Denies any systemic complaints such as fevers, chills, nausea, vomiting. No acute changes since last appointment, and no other complaints at this time.   Objective: AAO x3, NAD DP/PT pulses palpable bilaterally, CRT less than 3 seconds Left hallux toenail has some mild incurvation of both medial and lateral aspects of the nail corners mostly distally. There iscontinued tenderness palpation the nail borders there is no edema, erythema, drainage or pus any clinical signs of infection. There does appear to be a horizontal ridge in the nail although distally and appears to be growing out. The proximal nail borders be clear but the distal portion appears have some discoloration.  No open lesions or pre-ulcerative lesions.  No pain with calf compression, swelling, warmth, erythema  Assessment: Ingrown toenail, onychodystrophy with improvement  Plan: -All treatment options discussed with the patient including all alternatives, risks, complications.  -I did debride the left hallux toenail today without any complications or bleeding in the toenail out. Continue the urea cream daily. Monitor for signs or symptoms of infection. Again which stop any nail procedure. -Patient encouraged to call the office with any questions, concerns, change in symptoms.   Celesta Gentile, DPM

## 2017-03-05 ENCOUNTER — Ambulatory Visit: Payer: BLUE CROSS/BLUE SHIELD | Admitting: Podiatry

## 2018-04-28 ENCOUNTER — Other Ambulatory Visit: Payer: Self-pay | Admitting: Family Medicine

## 2018-04-28 DIAGNOSIS — R1011 Right upper quadrant pain: Secondary | ICD-10-CM

## 2018-05-07 ENCOUNTER — Other Ambulatory Visit: Payer: BLUE CROSS/BLUE SHIELD

## 2018-05-09 ENCOUNTER — Ambulatory Visit
Admission: RE | Admit: 2018-05-09 | Discharge: 2018-05-09 | Disposition: A | Discharge status: Home / Self Care | Payer: BLUE CROSS/BLUE SHIELD | Source: Ambulatory Visit | Attending: Family Medicine | Admitting: Family Medicine

## 2018-05-09 DIAGNOSIS — R1011 Right upper quadrant pain: Secondary | ICD-10-CM

## 2018-05-19 ENCOUNTER — Other Ambulatory Visit: Payer: Self-pay | Admitting: Gastroenterology

## 2018-05-19 DIAGNOSIS — R1011 Right upper quadrant pain: Secondary | ICD-10-CM

## 2018-05-21 ENCOUNTER — Other Ambulatory Visit: Payer: Self-pay | Admitting: Gastroenterology

## 2018-05-27 ENCOUNTER — Other Ambulatory Visit: Payer: BLUE CROSS/BLUE SHIELD

## 2018-06-10 ENCOUNTER — Ambulatory Visit
Admission: RE | Admit: 2018-06-10 | Discharge: 2018-06-10 | Disposition: A | Discharge status: Home / Self Care | Payer: BLUE CROSS/BLUE SHIELD | Source: Ambulatory Visit | Attending: Gastroenterology | Admitting: Gastroenterology

## 2018-06-10 ENCOUNTER — Encounter: Payer: Self-pay | Admitting: Radiology

## 2018-06-10 DIAGNOSIS — R1011 Right upper quadrant pain: Secondary | ICD-10-CM

## 2018-06-10 MED ORDER — IOPAMIDOL (ISOVUE-300) INJECTION 61%
100.0000 mL | Freq: Once | INTRAVENOUS | Status: AC | PRN
  Administered 2018-06-10: 100 mL via INTRAVENOUS

## 2019-04-28 NOTE — Progress Notes (Signed)
Virtual Visit via Video Note   This visit type was conducted due to national recommendations for restrictions regarding the COVID-19 Pandemic (e.g. social distancing) in an effort to limit this patient's exposure and mitigate transmission in our community.  Due to his co-morbid illnesses, this patient is at least at moderate risk for complications without adequate follow up.  This format is felt to be most appropriate for this patient at this time.  All issues noted in this document were discussed and addressed.  A limited physical exam was performed with this format.  Please refer to the patient's chart for his consent to telehealth for [facility].   Date:  04/29/2019   ID:  Ryan Williamson, DOB March 28, 1976, MRN [id number]  Patient Location: Home Provider Location: Home  PCP:  London Pepper, MD  Cardiologist:  Minus Breeding, MD  Electrophysiologist:  None   Evaluation Performed:  Follow-Up Visit  Chief Complaint:  Palpitations  History of Present Illness:    Ryan Williamson is a 44 y.o. male who presents for follow up of palpitations.  He was recently treated with metoprolol.  He had Cardizem PRN added.    It has been a couple of years since we last saw him.  However, he has had some more palpitations.  He thought he was doing better when he took metoprolol tartrate but he has been getting succinate.  He has some increased palpitations.  His blood pressure has been fluctuating too.  He had a cough and they switched him to Cozaar but I do not know that this controlled his blood pressure and his cough is not necessarily changed so he is back on lisinopril.  His blood pressure is running a little bit high.  However, he has gained weight with Covid.  He has been under stress with Covid.  He does have a 90-year-old now.    The patient denies any new symptoms such as chest discomfort, neck or arm discomfort. There has been no new shortness of breath, PND or orthopnea. There has been no  reported presyncope or syncope.   The patient does not have symptoms concerning for COVID-19 infection (fever, chills, cough, or new shortness of breath).    Past Medical History:  Diagnosis Date  . Diverticulitis   . SVT (supraventricular tachycardia) ([place]) 2013   Diagnosed at the [facility]   History reviewed. No pertinent surgical history.   Prior to Admission medications   Medication Sig Start Date End Date Taking? Authorizing Provider  lisinopril (ZESTRIL) 20 MG tablet Take 20 mg by mouth daily.   Yes [provider]  metoprolol (LOPRESSOR) 100 MG tablet Take 100 mg by mouth 2 (two) times daily as needed (palpatitions).    Yes [provider]  urea (CARMOL) 40 % CREA Apply 1-2 g topically 2 (two) times daily. 11/29/16  Yes Trula Slade, DPM  losartan (COZAAR) 50 MG tablet Take by mouth. 11/19/18   [provider]     Allergies:   Crab (diagnostic), Seasonal ic [cholestatin], and Sulfa antibiotics   Social History   Tobacco Use  . Smoking status: Never Smoker  . Smokeless tobacco: Never Used  Substance Use Topics  . Alcohol use: Yes  . Drug use: Not on file     Family Hx: The patient's family history is not on file.  ROS:   Please see the history of present illness.     All other systems reviewed and are negative.   Prior CV  studies:   The following studies were reviewed today:  None  Labs/Other Tests and Data Reviewed:    EKG:  The patient's [facility] cardiac telemetry strip(s) personally reviewed today demonstrate:  NSR  Recent Labs: No results found for requested labs within last 8760 hours.   Recent Lipid Panel No results found for: CHOL, TRIG, HDL, CHOLHDL, LDLCALC, LDLDIRECT  Wt Readings from Last 3 Encounters:  05/01/16 241 lb 6.4 oz (109.5 kg)  05/10/15 241 lb 6.4 oz (109.5 kg)  12/16/13 245 lb (111.1 kg)     Objective:    Vital Signs:  BP 136/84   Pulse 85    VITAL SIGNS:  reviewed GEN:  no acute  distress NEURO:  alert and oriented x 3, no obvious focal deficit PSYCH:  normal affect  ASSESSMENT & PLAN:    SVT:   He has had no symptomatic recurrence of this.  No change in therapy.  PALPITATIONS: I am going to switch him to metoprolol tartrate 100 mg twice daily.  I will also give him propranolol 10 mg as needed.  He is going to get some blood work soon and so can have his TSH checked.  Let me know if he has any increasing tachypalpitations.  HTN: Rather than changing his blood pressure medicines at suggested he lose weight he has gained during the pandemic and to keep a track on his blood pressure and he agrees with this.  COVID-19 Education: The signs and symptoms of COVID-19 were discussed with the patient and how to seek care for testing (follow up with PCP or arrange E-visit).  We talked about the vaccine.  The importance of social distancing was discussed today.  Time:   Today, I have spent 25 minutes with the patient with telehealth technology discussing the above problems.     Medication Adjustments/Labs and Tests Ordered: Current medicines are reviewed at length with the patient today.  Concerns regarding medicines are outlined above.   Tests Ordered: No orders of the defined types were placed in this encounter.   Medication Changes: Meds ordered this encounter  Medications  . metoprolol tartrate (LOPRESSOR) 100 MG tablet    Sig: Take 1 tablet (100 mg total) by mouth 2 (two) times daily.    Dispense:  180 tablet    Refill:  3  . propranolol (INDERAL) 10 MG tablet    Sig: Take 1 tablet (10 mg total) by mouth 2 (two) times daily as needed.    Dispense:  30 tablet    Refill:  3    Follow Up:  In Person six months  Signed, Minus Breeding, MD  04/29/2019 11:57 AM    [place]

## 2019-04-29 ENCOUNTER — Encounter: Payer: Self-pay | Admitting: Cardiology

## 2019-04-29 ENCOUNTER — Telehealth (INDEPENDENT_AMBULATORY_CARE_PROVIDER_SITE_OTHER): Payer: BLUE CROSS/BLUE SHIELD | Admitting: Cardiology

## 2019-04-29 VITALS — BP 136/84 | HR 85

## 2019-04-29 DIAGNOSIS — R002 Palpitations: Secondary | ICD-10-CM

## 2019-04-29 DIAGNOSIS — Z7189 Other specified counseling: Secondary | ICD-10-CM

## 2019-04-29 DIAGNOSIS — I471 Supraventricular tachycardia: Secondary | ICD-10-CM

## 2019-04-29 DIAGNOSIS — I10 Essential (primary) hypertension: Secondary | ICD-10-CM

## 2019-04-29 MED ORDER — PROPRANOLOL HCL 10 MG PO TABS: 10.0000 mg | ORAL_TABLET | Freq: Two times a day (BID) | ORAL | 3 refills | Status: DC | PRN

## 2019-04-29 MED ORDER — METOPROLOL TARTRATE 100 MG PO TABS: 100.0000 mg | ORAL_TABLET | Freq: Two times a day (BID) | ORAL | 3 refills | Status: DC

## 2019-04-29 NOTE — Patient Instructions (Signed)
Medication Instructions:  Start Metoprolol Tartrate 100mg  twice a day Start Propranolol 10mg  twice a day AS NEEDED Cozaar removed from med list *If you need a refill on your cardiac medications before your next appointment, please call your pharmacy*  Lab Work: None  Testing/Procedures: None  Follow-Up: At [facility], you and your health needs are our priority.  As part of our continuing mission to provide you with exceptional heart care, we have created designated Provider Care Teams.  These Care Teams include your primary Cardiologist (physician) and Advanced Practice Providers (APPs -  Physician Assistants and Nurse Practitioners) who all work together to provide you with the care you need, when you need it.  Your next appointment:   6 month(s)  The format for your next appointment:   In Person  Provider:   Minus Breeding, MD

## 2019-04-30 ENCOUNTER — Ambulatory Visit: Payer: BLUE CROSS/BLUE SHIELD | Admitting: Cardiology

## 2019-07-21 ENCOUNTER — Telehealth: Payer: Self-pay | Admitting: Cardiology

## 2019-07-21 NOTE — Telephone Encounter (Signed)
It is unlikely but he could reduce the dose.  Is he taking this every day?

## 2019-07-21 NOTE — Telephone Encounter (Signed)
He can go to 50 bid and see what happens.

## 2019-07-21 NOTE — Telephone Encounter (Signed)
Spoke to patient . Patient states he went to see primary yesterday . Patient has noticed he has muscle twitches mostly in the lower legs occurring at night .  Patient states it is sometimes constant. Patient states he has noticed more since changing to Metoprolol tartrate 100 mg twice a day. He takes dose @ 7 am and 5 pm.   Primary t wanted him to contact  Cardiology to see if this maybe  a side effect . No redness , no swelling , no change coloration or  Temperature.    no other changes with medication , only new thing patient states he had the covid vaccine .    patient aware will defer to Dr Percival Spanish and pharmacist for information and contact him back.

## 2019-07-21 NOTE — Telephone Encounter (Signed)
Spoke with patient. Patient will try to decrease metoprolol to 50mg  BID to see if that helps. He does not want medication list updated or a new prescription at this time because he is only going to trial the new dosage.

## 2019-07-21 NOTE — Telephone Encounter (Signed)
New message:    Patient calling stating that his  primary care doctor wanted him to call to ask about Metoprolol 10 mg concering patient might have side effects.

## 2019-09-28 ENCOUNTER — Ambulatory Visit: Payer: BLUE CROSS/BLUE SHIELD | Admitting: Physician Assistant

## 2019-11-02 ENCOUNTER — Ambulatory Visit: Payer: BLUE CROSS/BLUE SHIELD | Admitting: Physician Assistant

## 2019-11-19 ENCOUNTER — Ambulatory Visit: Payer: BLUE CROSS/BLUE SHIELD | Admitting: Cardiology

## 2019-12-24 NOTE — Progress Notes (Deleted)
Cardiology Clinic Note   Patient Name: Ryan Williamson Date of Encounter: 12/24/2019  Primary Care Provider:  London Pepper, MD Primary Cardiologist:  Minus Breeding, MD  Patient Profile    ***  Past Medical History    Past Medical History:  Diagnosis Date  . Diverticulitis   . SVT (supraventricular tachycardia) ([place]) 2013   Diagnosed at the [facility]   No past surgical history on file.  Allergies  Allergies  Allergen Reactions  . Crab (Diagnostic) Rash    Crab (food)  . Seasonal Ic [Cholestatin]   . Sulfa Antibiotics Hives    Hives     History of Present Illness    ***  Home Medications    Prior to Admission medications   Medication Sig Start Date End Date Taking? Authorizing Provider  lisinopril (ZESTRIL) 20 MG tablet Take 20 mg by mouth daily.    [provider]  metoprolol tartrate (LOPRESSOR) 100 MG tablet Take 1 tablet (100 mg total) by mouth 2 (two) times daily. 04/29/19 07/28/19  Minus Breeding, MD  propranolol (INDERAL) 10 MG tablet Take 1 tablet (10 mg total) by mouth 2 (two) times daily as needed. 04/29/19   Minus Breeding, MD  urea (CARMOL) 40 % CREA Apply 1-2 g topically 2 (two) times daily. 11/29/16   Trula Slade, DPM    Family History    No family history on file. has no family status information on file.   Social History    Social History   Socioeconomic History  . Marital status: Single    Spouse name: Not on file  . Number of children: Not on file  . Years of education: Not on file  . Highest education level: Not on file  Occupational History  . Not on file  Tobacco Use  . Smoking status: Never Smoker  . Smokeless tobacco: Never Used  Substance and Sexual Activity  . Alcohol use: Yes  . Drug use: Not on file  . Sexual activity: Not on file  Other Topics Concern  . Not on file  Social History Narrative  . Not on file   Social Determinants of Health   Financial Resource Strain:   . Difficulty of  Paying Living Expenses: Not on file  Food Insecurity:   . Worried About Charity fundraiser in the Last Year: Not on file  . Ran Out of Food in the Last Year: Not on file  Transportation Needs:   . Lack of Transportation (Medical): Not on file  . Lack of Transportation (Non-Medical): Not on file  Physical Activity:   . Days of Exercise per Week: Not on file  . Minutes of Exercise per Session: Not on file  Stress:   . Feeling of Stress : Not on file  Social Connections:   . Frequency of Communication with Friends and Family: Not on file  . Frequency of Social Gatherings with Friends and Family: Not on file  . Attends Religious Services: Not on file  . Active Member of Clubs or Organizations: Not on file  . Attends Archivist Meetings: Not on file  . Marital Status: Not on file  Intimate Partner Violence:   . Fear of Current or Ex-Partner: Not on file  . Emotionally Abused: Not on file  . Physically Abused: Not on file  . Sexually Abused: Not on file     Review of Systems    General:  No chills, fever, night sweats or weight changes.  Cardiovascular:  No chest pain, dyspnea on exertion, edema, orthopnea, palpitations, paroxysmal nocturnal dyspnea. Dermatological: No rash, lesions/masses Respiratory: No cough, dyspnea Urologic: No hematuria, dysuria Abdominal:   No nausea, vomiting, diarrhea, bright red blood per rectum, melena, or hematemesis Neurologic:  No visual changes, wkns, changes in mental status. All other systems reviewed and are otherwise negative except as noted above.  Physical Exam    VS:  There were no vitals taken for this visit. , BMI There is no height or weight on file to calculate BMI. GEN: Well nourished, well developed, in no acute distress. HEENT: normal. Neck: Supple, no JVD, carotid bruits, or masses. Cardiac: RRR, no murmurs, rubs, or gallops. No clubbing, cyanosis, edema.  Radials/DP/PT 2+ and equal bilaterally.  Respiratory:   Respirations regular and unlabored, clear to auscultation bilaterally. GI: Soft, nontender, nondistended, BS + x 4. MS: no deformity or atrophy. Skin: warm and dry, no rash. Neuro:  Strength and sensation are intact. Psych: Normal affect.  Accessory Clinical Findings    Recent Labs: No results found for requested labs within last 8760 hours.   Recent Lipid Panel No results found for: CHOL, TRIG, HDL, CHOLHDL, VLDL, LDLCALC, LDLDIRECT  ECG personally reviewed by me today- *** - No acute changes  Assessment & Plan   1.  ***   Jossie Ng. Niesha Bame NP-C    12/24/2019, 7:12 AM [facility] [address] [address] Office [phone] Fax [phone]  Notice: This dictation was prepared with Dragon dictation along with smaller phrase technology. Any transcriptional errors that result from this process are unintentional and may not be corrected upon review.

## 2019-12-25 ENCOUNTER — Ambulatory Visit: Payer: BLUE CROSS/BLUE SHIELD | Admitting: General Practice

## 2020-01-16 NOTE — Progress Notes (Deleted)
Cardiology Office Note:    Date:  01/16/2020   ID:  Ryan Williamson, DOB 08-26-1975, MRN [id number]  PCP:  London Pepper, MD  Cardiologist:  Minus Breeding, MD  Electrophysiologist:  None   Referring MD: London Pepper, MD   Chief Complaint: follow-up of palpitations and paroxysmal SVT  History of Present Illness:    Ryan Williamson is a 44 y.o. male with a history of paroxysmal SVT and hypertension who is followed by Dr. Percival Spanish.  Patient first seen by Dr. Percival Spanish in 2017 to establish cardiac care after moving to the [place] area from [place]. He was diagnosed with SVT in 2013 at the [facility]. 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 He was treated with beta-blockers. There was a discussion of ablation; however, symptoms resolved almost completely with beta-blockers. Patient was last seen by Dr. Percival Spanish for a virtual visit in 04/2019 at which time he was having more palpitations and fluctuating BP. His Toprol-XL was switched to Lopressor 100mg  twice daily and he was given Propranolol 10mg  to take as needed for further control of his palpitations.   Patient presents today for follow-up. ***  Palpitations Paroxysmal SVT - ***. - Continue Lopressor 100mg  twice daily. Can continue to take Propranolol 10mg  as needed.   Hypertension - ***. - Continue Lisinopril 20mg  daily and Lopressor 100mg  twice daily.    Past Medical History:  Diagnosis Date  . Diverticulitis   . SVT (supraventricular tachycardia) ([place]) 2013   Diagnosed at the [facility]    No past surgical history on file.  Current Medications: No outpatient medications have been marked as taking for the 01/20/20 encounter (Appointment) with Darreld Mclean, PA-C.     Allergies:   Crab (diagnostic), Seasonal ic [cholestatin], and Sulfa antibiotics   Social History   Socioeconomic History  . Marital status: Single    Spouse name: Not on file  . Number of children: Not on file  .  Years of education: Not on file  . Highest education level: Not on file  Occupational History  . Not on file  Tobacco Use  . Smoking status: Never Smoker  . Smokeless tobacco: Never Used  Substance and Sexual Activity  . Alcohol use: Yes  . Drug use: Not on file  . Sexual activity: Not on file  Other Topics Concern  . Not on file  Social History Narrative  . Not on file   Social Determinants of Health   Financial Resource Strain:   . Difficulty of Paying Living Expenses: Not on file  Food Insecurity:   . Worried About Charity fundraiser in the Last Year: Not on file  . Ran Out of Food in the Last Year: Not on file  Transportation Needs:   . Lack of Transportation (Medical): Not on file  . Lack of Transportation (Non-Medical): Not on file  Physical Activity:   . Days of Exercise per Week: Not on file  . Minutes of Exercise per Session: Not on file  Stress:   . Feeling of Stress : Not on file  Social Connections:   . Frequency of Communication with Friends and Family: Not on file  . Frequency of Social Gatherings with Friends and Family: Not on file  . Attends Religious Services: Not on file  . Active Member of Clubs or Organizations: Not on file  . Attends Archivist Meetings: Not on file  . Marital Status: Not on file     Family History:  The patient's ***family history is not on file.  ROS:   Please see the history of present illness.    *** All other systems reviewed and are negative.  EKGs/Labs/Other Studies Reviewed:    The following studies were reviewed today: N/A.  EKG:  EKG ordered today. EKG personally reviewed and demonstrates ***.  Recent Labs: No results found for requested labs within last 8760 hours.  Recent Lipid Panel No results found for: CHOL, TRIG, HDL, CHOLHDL, VLDL, LDLCALC, LDLDIRECT  Physical Exam:    Vital Signs: There were no vitals taken for this visit.    Wt Readings from Last 3 Encounters:  05/01/16 241 lb 6.4 oz  (109.5 kg)  05/10/15 241 lb 6.4 oz (109.5 kg)  12/16/13 245 lb (111.1 kg)     General: 44 y.o. male in no acute distress. HEENT: Normocephalic and atraumatic. Sclera clear. EOMs intact. Neck: Supple. No carotid bruits. No JVD. Heart: *** RRR. Distinct S1 and S2. No murmurs, gallops, or rubs. Radial and distal pedal pulses 2+ and equal bilaterally. Lungs: No increased work of breathing. Clear to ausculation bilaterally. No wheezes, rhonchi, or rales.  Abdomen: Soft, non-distended, and non-tender to palpation. Bowel sounds present in all 4 quadrants.  MSK: Normal strength and tone for age. *** Extremities: No lower extremity edema.    Skin: Warm and dry. Neuro: Alert and oriented x3. No focal deficits. Psych: Normal affect. Responds appropriately.   Assessment:    No diagnosis found.  Plan:     Disposition: Follow up in ***   Medication Adjustments/Labs and Tests Ordered: Current medicines are reviewed at length with the patient today.  Concerns regarding medicines are outlined above.  No orders of the defined types were placed in this encounter.  No orders of the defined types were placed in this encounter.   There are no Patient Instructions on file for this visit.   Signed, Darreld Mclean, PA-C  01/16/2020 11:29 AM    [facility]

## 2020-01-20 ENCOUNTER — Ambulatory Visit: Payer: BLUE CROSS/BLUE SHIELD | Admitting: Student

## 2020-02-15 DIAGNOSIS — R002 Palpitations: Secondary | ICD-10-CM | POA: Insufficient documentation

## 2020-02-15 NOTE — Progress Notes (Deleted)
Cardiology Office Note   Date:  02/15/2020   ID:  Ryan Williamson, DOB 1975-09-23, MRN [id number]  PCP:  London Pepper, MD  Cardiologist:   Minus Breeding, MD Referring:  ***  No chief complaint on file.     History of Present Illness: Ryan Williamson is a 44 y.o. male  who presents for follow up of palpitations.  He was recently treated with metoprolol.  He had Cardizem PRN added.   ***  It has been a couple of years since we last saw him.  However, he has had some more palpitations.  He thought he was doing better when he took metoprolol tartrate but he has been getting succinate.  He has some increased palpitations.  His blood pressure has been fluctuating too.  He had a cough and they switched him to Cozaar but I do not know that this controlled his blood pressure and his cough is not necessarily changed so he is back on lisinopril.  His blood pressure is running a little bit high.  However, he has gained weight with Covid.  He has been under stress with Covid.  He does have a 37-year-old now.    The patient denies any new symptoms such as chest discomfort, neck or arm discomfort. There has been no new shortness of breath, PND or orthopnea. There has been no reported presyncope or syncope.    Past Medical History:  Diagnosis Date  . Diverticulitis   . SVT (supraventricular tachycardia) ([place]) 2013   Diagn[facility] at the [facility]    No past surgical history on file.   Current Outpatient Medications  Medication Sig Dispense Refill  . lisinopril (ZESTRIL) 20 MG tablet Take 20 mg by mouth daily.    . metoprolol tartrate (LOPRESSOR) 100 MG tablet Take 1 tablet (100 mg total) by mouth 2 (two) times daily. 180 tablet 3  . propranolol (INDERAL) 10 MG tablet Take 1 tablet (10 mg total) by mouth 2 (two) times daily as needed. 30 tablet 3  . urea (CARMOL) 40 % CREA Apply 1-2 g topically 2 (two) times daily. 85 each 3   No current facility-administered medications for this visit.     Allergies:   Crab (diagnostic), Seasonal ic [cholestatin], and Sulfa antibiotics    ROS:  Please see the history of present illness.   Otherwise, review of systems are positive for {NONE DEFAULTED:18576::"none"}.   All other systems are reviewed and negative.    PHYSICAL EXAM: VS:  There were no vitals taken for this visit. , BMI There is no height or weight on file to calculate BMI. GENERAL:  Well appearing NECK:  No jugular venous distention, waveform within normal limits, carotid upstroke brisk and symmetric, no bruits, no thyromegaly LUNGS:  Clear to auscultation bilaterally CHEST:  Unremarkable HEART:  PMI not displaced or sustained,S1 and S2 within normal limits, no S3, no S4, no clicks, no rubs, *** murmurs ABD:  Flat, positive bowel sounds normal in frequency in pitch, no bruits, no rebound, no guarding, no midline pulsatile mass, no hepatomegaly, no splenomegaly EXT:  2 plus pulses throughout, no edema, no cyanosis no clubbing     ***GENERAL:  Well appearing HEENT:  Pupils equal round and reactive, fundi not visualized, oral mucosa unremarkable NECK:  No jugular venous distention, waveform within normal limits, carotid upstroke brisk and symmetric, no bruits, no thyromegaly LYMPHATICS:  No cervical, inguinal adenopathy LUNGS:  Clear to auscultation bilaterally BACK:  No CVA tenderness CHEST:  Unremarkable HEART:  PMI not displaced or sustained,S1 and S2 within normal limits, no S3, no S4, no clicks, no rubs, *** murmurs ABD:  Flat, positive bowel sounds normal in frequency in pitch, no bruits, no rebound, no guarding, no midline pulsatile mass, no hepatomegaly, no splenomegaly EXT:  2 plus pulses throughout, no edema, no cyanosis no clubbing SKIN:  No rashes no nodules NEURO:  Cranial nerves II through XII grossly intact, motor grossly intact throughout PSYCH:  Cognitively intact, oriented to person place and time    EKG:  EKG {ACTION; IS/IS XTK:24097353} ordered  today. The ekg ordered today demonstrates ***   Recent Labs: No results found for requested labs within last 8760 hours.    Lipid Panel No results found for: CHOL, TRIG, HDL, CHOLHDL, VLDL, LDLCALC, LDLDIRECT    Wt Readings from Last 3 Encounters:  05/01/16 241 lb 6.4 oz (109.5 kg)  05/10/15 241 lb 6.4 oz (109.5 kg)  12/16/13 245 lb (111.1 kg)      Other studies Reviewed: Additional studies/ records that were reviewed today include: ***. Review of the above records demonstrates:  Please see elsewhere in the note.  ***   ASSESSMENT AND PLAN:  SVT:   *** He has had no symptomatic recurrence of this.  No change in therapy.  PALPITATIONS:   ***  I am going to switch him to metoprolol tartrate 100 mg twice daily.  I will also give him propranolol 10 mg as needed.  He is going to get some blood work soon and so can have his TSH checked.  Let me know if he has any increasing tachypalpitations.  HTN:   ***  Rather than changing his blood pressure medicines at suggested he lose weight he has gained during the pandemic and to keep a track on his blood pressure and he agrees with this.    Current medicines are reviewed at length with the patient today.  The patient {ACTIONS; HAS/DOES NOT HAVE:19233} concerns regarding medicines.  The following changes have been made:  {PLAN; NO CHANGE:13088:s}  Labs/ tests ordered today include: *** No orders of the defined types were placed in this encounter.    Disposition:   FU with ***    Signed, Minus Breeding, MD  02/15/2020 9:37 AM    [facility]

## 2020-02-16 ENCOUNTER — Ambulatory Visit: Payer: BLUE CROSS/BLUE SHIELD | Admitting: Cardiology

## 2020-02-22 ENCOUNTER — Encounter: Payer: Self-pay | Admitting: Dermatology

## 2020-02-22 ENCOUNTER — Other Ambulatory Visit: Payer: Self-pay

## 2020-02-22 ENCOUNTER — Ambulatory Visit (INDEPENDENT_AMBULATORY_CARE_PROVIDER_SITE_OTHER): Payer: BLUE CROSS/BLUE SHIELD | Admitting: Dermatology

## 2020-02-22 DIAGNOSIS — D485 Neoplasm of uncertain behavior of skin: Secondary | ICD-10-CM

## 2020-02-22 DIAGNOSIS — Z1283 Encounter for screening for malignant neoplasm of skin: Secondary | ICD-10-CM

## 2020-02-22 NOTE — Patient Instructions (Signed)

## 2020-03-07 ENCOUNTER — Encounter: Payer: Self-pay | Admitting: Dermatology

## 2020-03-07 NOTE — Progress Notes (Signed)
   Follow-Up Visit   Subjective  Ryan Williamson is a 44 y.o. male who presents for the following: Skin Problem (Patient here today for bump on left side of nose x 2 months. Per patient he hit it once and it did bleed, no pain, rough and crusty.).  Growth Location: Nose Duration: Months Quality:  Associated Signs/Symptoms: Modifying Factors:  Severity:  Timing: Context:   Objective  Well appearing patient in no apparent distress; mood and affect are within normal limits.  All skin waist up examined.   Assessment & Plan    Neoplasm of uncertain behavior of skin Left Nasal Sidewall  Skin / nail biopsy Type of biopsy: tangential   Informed consent: discussed and consent obtained   Timeout: patient name, date of birth, surgical site, and procedure verified   Procedure prep:  Patient was prepped and draped in usual sterile fashion (Non sterile) Prep type:  Chlorhexidine Anesthesia: the lesion was anesthetized in a standard fashion   Anesthetic:  1% lidocaine w/ epinephrine 1-100,000 local infiltration Instrument used: flexible razor blade   Outcome: patient tolerated procedure well   Post-procedure details: wound care instructions given    Specimen 1 - Surgical pathology Differential Diagnosis: scc vs bcc Check Margins: No  Encounter for screening for malignant neoplasm of skin Mid Back  Self examination skin twice annually.     I, Lavonna Monarch, MD, have reviewed all documentation for this visit.  The documentation on 03/07/20 for the exam, diagnosis, procedures, and orders are all accurate and complete.

## 2020-03-26 NOTE — Progress Notes (Signed)
Cardiology Office Note   Date:  03/28/2020   ID:  Hymie Gorr, DOB 12-May-1975, MRN [id number]  PCP:  London Pepper, MD  Cardiologist:   Minus Breeding, MD   Chief Complaint  Patient presents with  . Palpitations      History of Present Illness: Ryan Williamson is a 44 y.o. male who presents for follow up of palpitations.  He was recently treated with metoprolol.  He had Cardizem PRN added.    At the last visit I adjusted his beta blocker but he had leg problems that he thought were related to increased metoprolol.   I suggested reducing that dose.  However, he found that his fasciculations were probably more related to stress.  He has been to the [facility] and been told he has some benign fasciculations causing his legs to jump.  He thinks he feels better with 100 mg immediate release metoprolol twice daily.  Is really not having much in the way of palpitations.  He has gained weight because is not been physically active.  He denies chest pressure, neck or arm discomfort.  They have no new shortness of breath, PND or orthopnea.  He has had no edema   Past Medical History:  Diagnosis Date  . Diverticulitis   . SVT (supraventricular tachycardia) ([place]) 2013   Diagnosed at the [facility]    History reviewed. No pertinent surgical history.   Current Outpatient Medications  Medication Sig Dispense Refill  . lisinopril (ZESTRIL) 20 MG tablet Take 1 tablet (20 mg total) by mouth daily. 90 tablet 3  . metoprolol tartrate (LOPRESSOR) 100 MG tablet Take 1 tablet (100 mg total) by mouth 2 (two) times daily. 180 tablet 3   No current facility-administered medications for this visit.    Allergies:   Crab (diagnostic), Seasonal ic [cholestatin], and Sulfa antibiotics    ROS:  Please see the history of present illness.   Otherwise, review of systems are positive for none.   All other systems are reviewed and negative.    PHYSICAL EXAM: VS:  BP 124/84 (BP Location:  Left Arm, Patient Position: Sitting)   Pulse 75   Resp 16   Ht 6' (1.829 m)   Wt 269 lb 12.8 oz (122.4 kg)   SpO2 99%   BMI 36.59 kg/m  , BMI Body mass index is 36.59 kg/m. GENERAL:  Well appearing NECK:  No jugular venous distention, waveform within normal limits, carotid upstroke brisk and symmetric, no bruits, no thyromegaly LUNGS:  Clear to auscultation bilaterally CHEST:  Unremarkable HEART:  PMI not displaced or sustained,S1 and S2 within normal limits, no S3, no S4, no clicks, no rubs, no murmurs ABD:  Flat, positive bowel sounds normal in frequency in pitch, no bruits, no rebound, no guarding, no midline pulsatile mass, no hepatomegaly, no splenomegaly EXT:  2 plus pulses throughout, no edema, no cyanosis no clubbing    EKG:  EKG is ordered today. The ekg ordered today demonstrates sinus rhythm, rate 79, axis within normal limits, intervals within normal limits, no acute ST-T wave changes.   Recent Labs: No results found for requested labs within last 8760 hours.    Lipid Panel No results found for: CHOL, TRIG, HDL, CHOLHDL, VLDL, LDLCALC, LDLDIRECT    Wt Readings from Last 3 Encounters:  03/28/20 269 lb 12.8 oz (122.4 kg)  05/01/16 241 lb 6.4 oz (109.5 kg)  05/10/15 241 lb 6.4 oz (109.5 kg)      Other  studies Reviewed: Additional studies/ records that were reviewed today include: None. Review of the above records demonstrates:  NA  ASSESSMENT AND PLAN:  SVT:    No symptomatic runs of this.  No change in therapy.   PALPITATIONS:   He thinks the current dose of beta-blocker works well.  No change in therapy.  HTN:  He has had a reduced dose of his ACE inhibitor and his blood pressure seems to be well controlled.  We talked about diet for weight loss which will also contribute.  He should continue on the meds as listed.    Current medicines are reviewed at length with the patient today.  The patient does not have concerns regarding medicines.  The following  changes have been made:  None  Labs/ tests ordered today include: None  Orders Placed This Encounter  Procedures  . EKG 12-Lead     Disposition:   FU with me in 12 months.     Signed, Minus Breeding, MD  03/28/2020 10:28 AM    [facility]

## 2020-03-28 ENCOUNTER — Encounter: Payer: Self-pay | Admitting: Cardiology

## 2020-03-28 ENCOUNTER — Ambulatory Visit (INDEPENDENT_AMBULATORY_CARE_PROVIDER_SITE_OTHER): Payer: BLUE CROSS/BLUE SHIELD | Admitting: Cardiology

## 2020-03-28 ENCOUNTER — Other Ambulatory Visit: Payer: Self-pay

## 2020-03-28 VITALS — BP 124/84 | HR 75 | Resp 16 | Ht 72.0 in | Wt 269.8 lb

## 2020-03-28 DIAGNOSIS — R002 Palpitations: Secondary | ICD-10-CM | POA: Diagnosis not present

## 2020-03-28 DIAGNOSIS — I10 Essential (primary) hypertension: Secondary | ICD-10-CM | POA: Diagnosis not present

## 2020-03-28 DIAGNOSIS — I471 Supraventricular tachycardia: Secondary | ICD-10-CM

## 2020-03-28 MED ORDER — LISINOPRIL 20 MG PO TABS: 20.0000 mg | ORAL_TABLET | Freq: Every day | ORAL | 3 refills | Status: DC

## 2020-03-28 MED ORDER — METOPROLOL TARTRATE 100 MG PO TABS: 100.0000 mg | ORAL_TABLET | Freq: Two times a day (BID) | ORAL | 3 refills | Status: DC

## 2020-03-28 NOTE — Patient Instructions (Addendum)
Medication Instructions:  Metoprolol Succinate changed to Metoprolol Tartrate 100mg  twice a day *If you need a refill on your cardiac medications before your next appointment, please call your pharmacy*  Lab Work: None ordered this visit  Testing/Procedures: None ordered this visit  Follow-Up: At [facility], you and your health needs are our priority.  As part of our continuing mission to provide you with exceptional heart care, we have created designated Provider Care Teams.  These Care Teams include your primary Cardiologist (physician) and Advanced Practice Providers (APPs -  Physician Assistants and Nurse Practitioners) who all work together to provide you with the care you need, when you need it.   Your next appointment:   12 month(s)  You will receive a reminder letter in the mail two months in advance. If you don't receive a letter, please call our office to schedule the follow-up appointment.  The format for your next appointment:   In Person  Provider:   Minus Breeding, MD

## 2020-04-22 ENCOUNTER — Other Ambulatory Visit: Payer: Self-pay

## 2020-04-22 ENCOUNTER — Telehealth: Payer: Self-pay | Admitting: Cardiology

## 2020-04-22 ENCOUNTER — Ambulatory Visit (INDEPENDENT_AMBULATORY_CARE_PROVIDER_SITE_OTHER): Payer: BLUE CROSS/BLUE SHIELD | Admitting: Cardiology

## 2020-04-22 ENCOUNTER — Encounter: Payer: Self-pay | Admitting: Cardiology

## 2020-04-22 VITALS — BP 128/72 | HR 78 | Ht 72.0 in | Wt 270.0 lb

## 2020-04-22 DIAGNOSIS — E7800 Pure hypercholesterolemia, unspecified: Secondary | ICD-10-CM

## 2020-04-22 DIAGNOSIS — R072 Precordial pain: Secondary | ICD-10-CM

## 2020-04-22 NOTE — Progress Notes (Signed)
Cardiology Office Note   Date:  04/22/2020   ID:  Ryan Williamson, DOB 15-Dec-1975, MRN [id number]  PCP:  London Pepper, MD  Cardiologist:   Minus Breeding, MD   Chief Complaint  Patient presents with  . Chest Pain    Shoulder pain.      History of Present Illness: Ryan Williamson is a 45 y.o. male who presents for follow up of palpitations.  He called today and was having some dull chest discomfort.  He says this has been a left shoulder discomfort.  Its been since mid December.  He says he feels like he cannot take a deep breath sometimes.  The discomfort is there all the time.  He took burning but waxes and wanes in intensity.  He feels a little more intense when he tries to take a deep breath.  Advil may have helped.  It does not really hurt with movement.  He is a little bit confused because he does have a bad rotator cuff on that side.  He has been able to be active and still rides a bike and has not brought this on with not activity.  He is not having any associated nausea vomiting or diaphoresis.  There is no PND or orthopnea.  He has had no palpitations, presyncope or syncope.  Past Medical History:  Diagnosis Date  . Diverticulitis   . SVT (supraventricular tachycardia) ([place]) 2013   Diagnosed at the [facility]    History reviewed. No pertinent surgical history.   Current Outpatient Medications  Medication Sig Dispense Refill  . lisinopril (ZESTRIL) 20 MG tablet Take 1 tablet (20 mg total) by mouth daily. 90 tablet 3  . metoprolol tartrate (LOPRESSOR) 100 MG tablet Take 1 tablet (100 mg total) by mouth 2 (two) times daily. 180 tablet 3   No current facility-administered medications for this visit.    Allergies:   Crab (diagnostic), Seasonal ic [cholestatin], and Sulfa antibiotics    ROS:  Please see the history of present illness.   Otherwise, review of systems are positive for none.   All other systems are reviewed and negative.    PHYSICAL EXAM: VS:  BP  128/72 (BP Location: Left Arm, Patient Position: Sitting, Cuff Size: Large)   Pulse 78   Ht 6' (1.829 m)   Wt 270 lb (122.5 kg)   BMI 36.62 kg/m  , BMI Body mass index is 36.62 kg/m. GENERAL:  Well appearing NECK:  No jugular venous distention, waveform within normal limits, carotid upstroke brisk and symmetric, no bruits, no thyromegaly LUNGS:  Clear to auscultation bilaterally CHEST:  Unremarkable HEART:  PMI not displaced or sustained,S1 and S2 within normal limits, no S3, no S4, no clicks, no rubs, no murmurs ABD:  Flat, positive bowel sounds normal in frequency in pitch, no bruits, no rebound, no guarding, no midline pulsatile mass, no hepatomegaly, no splenomegaly EXT:  2 plus pulses throughout, no edema, no cyanosis no clubbing   EKG:  EKG is  ordered today. The ekg ordered today demonstrates sinus rhythm, rate 78, axis within normal limits, intervals within normal limits, no acute ST-T wave changes.   Recent Labs: No results found for requested labs within last 8760 hours.    Lipid Panel No results found for: CHOL, TRIG, HDL, CHOLHDL, VLDL, LDLCALC, LDLDIRECT    Wt Readings from Last 3 Encounters:  04/22/20 270 lb (122.5 kg)  03/28/20 269 lb 12.8 oz (122.4 kg)  05/01/16 241 lb 6.4 oz (  109.5 kg)      Other studies Reviewed: Additional studies/ records that were reviewed today include: None. Review of the above records demonstrates:  NA  ASSESSMENT AND PLAN:  SVT:    Has had no further tachypalpitations.  No change in therapy.  CHEST PAIN: He has chest or shoulder pain.  I am going to start with a coronary calcium score.  If this is abnormal he will need a POET (Plain Old Exercise Treadmill)  HTN:  His blood pressure is controlled.  No change in therapy.  RISK REDUCTION: His LDL in 2019 was 135 with an HDL of 58.  I will repeat this calcium score..    Current medicines are reviewed at length with the patient today.  The patient does not have concerns regarding  medicines.  The following changes have been made:  None  Labs/ tests ordered today include:   None  Orders Placed This Encounter  Procedures  . CT CARDIAC SCORING (SELF PAY ONLY)  . Lipid panel  . EKG 12-Lead     Disposition:   FU with me in 12 months.     Signed, Minus Breeding, MD  04/22/2020 3:45 PM    [facility]

## 2020-04-22 NOTE — Patient Instructions (Addendum)
Medication Instructions:  Your physician recommends that you continue on your current medications as directed. Please refer to the Current Medication list given to you today.  *If you need a refill on your cardiac medications before your next appointment, please call your pharmacy*  Lab Work: FASTING LIPID WHEN YOU GO FOR YOUR CALCIUM SCORE   Testing/Procedures: CALCIUM SCORE  THIS WILL COST YOU $99 OUT OF POCKET [place] [address]   Follow-Up: At [facility], you and your health needs are our priority.  As part of our continuing mission to provide you with exceptional heart care, we have created designated Provider Care Teams.  These Care Teams include your primary Cardiologist (physician) and Advanced Practice Providers (APPs -  Physician Assistants and Nurse Practitioners) who all work together to provide you with the care you need, when you need it.  We recommend signing up for the patient portal called "MyChart".  Sign up information is provided on this After Visit Summary.  MyChart is used to connect with patients for Virtual Visits (Telemedicine).  Patients are able to view lab/test results, encounter notes, upcoming appointments, etc.  Non-urgent messages can be sent to your provider as well.   To learn more about what you can do with MyChart, go to [email or web address].    Your next appointment:   11  month(s)  The format for your next appointment:   In Person  Provider:   You may see Minus Breeding, MD or one of the following Advanced Practice Providers on your designated Care Team:    Rosaria Ferries, PA-C  Jory Sims, DNP, ANP

## 2020-04-22 NOTE — Telephone Encounter (Signed)
Spoke with patient of Dr. Percival Spanish who reports ongoing dull chest, left shoulder pain He has had rotator cuff issues in his left shoulder too, but that typically is a different pain sensation He states this occurs when he takes a deep breath He denies palpitations He would like to be seen today  Scheduled for 04/22/20 @ 2:20pm with Dr. Percival Spanish

## 2020-04-22 NOTE — Telephone Encounter (Signed)
Pt c/o of Chest Pain: STAT if CP now or developed within 24 hours  1. Are you having CP right now? Yes but it is dull   2. Are you experiencing any other symptoms (ex. SOB, nausea, vomiting, sweating)? Occasionally has a hard time taking a real deep breath. Not consistent though.  3. How long have you been experiencing CP? About 3 weeks  4. Is your CP continuous or coming and going? Constant dull pain  5. Have you taken Nitroglycerin? no ? Patient said pain is located in his chest/shoulder area and he is not sure if it is muscle related or heart related. He said when he does take a deep breath he can feel a sharp pain   Patient wanted to get checked out

## 2020-04-29 ENCOUNTER — Inpatient Hospital Stay: Admission: RE | Admit: 2020-04-29 | Payer: BLUE CROSS/BLUE SHIELD | Source: Ambulatory Visit

## 2020-04-29 ENCOUNTER — Other Ambulatory Visit: Payer: BLUE CROSS/BLUE SHIELD

## 2020-05-05 ENCOUNTER — Ambulatory Visit: Payer: BLUE CROSS/BLUE SHIELD | Admitting: Cardiology

## 2020-05-10 ENCOUNTER — Inpatient Hospital Stay: Admission: RE | Admit: 2020-05-10 | Payer: BLUE CROSS/BLUE SHIELD | Source: Ambulatory Visit

## 2020-05-23 ENCOUNTER — Inpatient Hospital Stay: Admission: RE | Admit: 2020-05-23 | Payer: BLUE CROSS/BLUE SHIELD | Source: Ambulatory Visit

## 2020-06-14 ENCOUNTER — Other Ambulatory Visit: Payer: Self-pay

## 2020-06-14 ENCOUNTER — Ambulatory Visit (INDEPENDENT_AMBULATORY_CARE_PROVIDER_SITE_OTHER)
Admission: RE | Admit: 2020-06-14 | Discharge: 2020-06-14 | Disposition: A | Discharge status: Home / Self Care | Payer: Self-pay | Source: Ambulatory Visit | Attending: Cardiology | Admitting: Cardiology

## 2020-06-14 DIAGNOSIS — R072 Precordial pain: Secondary | ICD-10-CM

## 2020-09-01 ENCOUNTER — Emergency Department (HOSPITAL_BASED_OUTPATIENT_CLINIC_OR_DEPARTMENT_OTHER): Payer: BLUE CROSS/BLUE SHIELD

## 2020-09-01 ENCOUNTER — Other Ambulatory Visit (HOSPITAL_BASED_OUTPATIENT_CLINIC_OR_DEPARTMENT_OTHER): Payer: Self-pay

## 2020-09-01 ENCOUNTER — Encounter (HOSPITAL_BASED_OUTPATIENT_CLINIC_OR_DEPARTMENT_OTHER): Payer: Self-pay | Admitting: *Deleted

## 2020-09-01 ENCOUNTER — Other Ambulatory Visit: Payer: Self-pay

## 2020-09-01 ENCOUNTER — Emergency Department (HOSPITAL_BASED_OUTPATIENT_CLINIC_OR_DEPARTMENT_OTHER)
Admission: EM | Admit: 2020-09-01 | Discharge: 2020-09-01 | Disposition: A | Discharge status: Home / Self Care | Payer: BLUE CROSS/BLUE SHIELD | Attending: Emergency Medicine | Admitting: Emergency Medicine

## 2020-09-01 DIAGNOSIS — W010XXA Fall on same level from slipping, tripping and stumbling without subsequent striking against object, initial encounter: Secondary | ICD-10-CM | POA: Insufficient documentation

## 2020-09-01 DIAGNOSIS — M549 Dorsalgia, unspecified: Secondary | ICD-10-CM

## 2020-09-01 DIAGNOSIS — Z79899 Other long term (current) drug therapy: Secondary | ICD-10-CM | POA: Insufficient documentation

## 2020-09-01 DIAGNOSIS — M546 Pain in thoracic spine: Secondary | ICD-10-CM | POA: Insufficient documentation

## 2020-09-01 DIAGNOSIS — I10 Essential (primary) hypertension: Secondary | ICD-10-CM | POA: Diagnosis not present

## 2020-09-01 MED ORDER — CYCLOBENZAPRINE HCL 10 MG PO TABS
10.0000 mg | ORAL_TABLET | Freq: Two times a day (BID) | ORAL | 0 refills | Status: DC | PRN
  Filled 2020-09-01: qty 14, 7d supply, fill #0

## 2020-09-01 NOTE — Discharge Instructions (Addendum)
Your xrays show no evidence of fracture (break). You can continue taking ibuprofen for pain.   You can apply topical Voltaren (diclofenac) gel directly to your area of pain.  This can be found on the shelf at the pharmacy. Apply ice to your back for 20 minutes at a time.  You can also apply heat if this provides more relief.   You can take Flexeril/cyclobenzaprine every 12 hours as needed for muscle spasm.  Be aware this medication can make you drowsy; do not take while driving or drinking alcohol.   Follow-up with your primary care provider symptoms persist.   Return to ER if new numbness or tingling in your arms or legs, inability to urinate, inability to hold your bowels, or weakness in your extremities.

## 2020-09-01 NOTE — ED Triage Notes (Signed)
Fall x 3 days with c/o mid  back pain

## 2020-09-01 NOTE — ED Provider Notes (Signed)
[facility] EMERGENCY DEPARTMENT Provider Note   CSN: [id number] Arrival date & time: 09/01/20  1247     History Chief Complaint  Patient presents with  . Fall    Ryan Williamson is a 45 y.o. male presenting for evaluation of back pain that began on Monday.  Patient states he slipped in the bathroom at night in the dark.  When he slipped his front was caught by the edge of the tub.  In falling this way, his back arched.  He did not hit his head or lose consciousness.  He has not been having any numbness or weakness, no abdominal symptoms or incontinence.  States he has been treating with 800 mg of ibuprofen every 12 hours with small amount of relief.  The history is provided by the patient.       Past Medical History:  Diagnosis Date  . Diverticulitis   . SVT (supraventricular tachycardia) ([place]) 2013   Diagnosed at the [facility]    Patient Active Problem List   Diagnosis Date Noted  . Palpitations 02/15/2020  . SVT (supraventricular tachycardia) ([place]) 05/10/2015  . HTN (hypertension) 05/10/2015  . Gastritis 12/16/2013    History reviewed. No pertinent surgical history.     No family history on file.  Social History   Tobacco Use  . Smoking status: Never Smoker  . Smokeless tobacco: Never Used  Substance Use Topics  . Alcohol use: Yes    Home Medications Prior to Admission medications   Medication Sig Start Date End Date Taking? Authorizing Provider  cyclobenzaprine (FLEXERIL) 10 MG tablet Take 1 tablet (10 mg total) by mouth 2 (two) times daily as needed for muscle spasms. 09/01/20  Yes Quentin Cornwall, [place] N, PA-C  lisinopril (ZESTRIL) 20 MG tablet Take 1 tablet (20 mg total) by mouth daily. 03/28/20   Minus Breeding, MD  metoprolol tartrate (LOPRESSOR) 100 MG tablet Take 1 tablet (100 mg total) by mouth 2 (two) times daily. 03/28/20 06/26/20  Minus Breeding, MD    Allergies    Crab (diagnostic), Seasonal ic [cholestatin], and Sulfa  antibiotics  Review of Systems   Review of Systems  Musculoskeletal: Positive for back pain.  All other systems reviewed and are negative.   Physical Exam Updated Vital Signs BP (!) 135/94 (BP Location: Right Arm)   Pulse 84   Temp 98.8 F (37.1 C) (Oral)   Resp 18   Ht 6' (1.829 m)   Wt 117.9 kg   SpO2 100%   BMI 35.26 kg/m   Physical Exam Vitals and nursing note reviewed.  Constitutional:      General: He is not in acute distress.    Appearance: He is well-developed.  HENT:     Head: Normocephalic and atraumatic.  Eyes:     Conjunctiva/sclera: Conjunctivae normal.  Cardiovascular:     Rate and Rhythm: Normal rate and regular rhythm.  Pulmonary:     Effort: Pulmonary effort is normal. No respiratory distress.     Breath sounds: Normal breath sounds.  Musculoskeletal:     Comments: Generalized TTP to mid/lower T spine and paraspinal region. No focal bony TTP, no deformity. Patient ambulatory around the room without difficulty.   Neurological:     Mental Status: He is alert.     Comments: Normal tone.  5/5 strength in BUE and BLE including strong and equal grip and dorsiflexion/plantar flexion Sensory:  light touch normal in BLE extremities.  b/l Gait: normal gait and balance CV: distal pulses  palpable throughout    Psychiatric:        Mood and Affect: Mood normal.        Behavior: Behavior normal.     ED Results / Procedures / Treatments   Labs (all labs ordered are listed, but only abnormal results are displayed) Labs Reviewed - No data to display  EKG None  Radiology DG Thoracic Spine W/Swimmers  Result Date: 09/01/2020 CLINICAL DATA:  Fall 3 days ago EXAM: THORACIC SPINE - 3 VIEWS COMPARISON:  None. FINDINGS: There is no evidence of thoracic spine fracture. Alignment is normal. Minimal degenerative osteophytes. IMPRESSION: No acute osseous abnormality Electronically Signed   By: Donavan Foil M.D.   On: 09/01/2020 16:25   DG Lumbar Spine  Complete  Result Date: 09/01/2020 CLINICAL DATA:  Mid back pain.  Fell 3 days ago. EXAM: LUMBAR SPINE - COMPLETE 4+ VIEW COMPARISON:  CT scan 06/10/2018 FINDINGS: Normal alignment of the lumbar vertebral bodies. No acute fracture. Disc spaces are maintained. The facets are normally aligned. No pars defects. The visualized bony pelvis is intact. The SI joints appear normal. Rounded sclerotic lesion in the right iliac bone is unchanged since 2020 and likely a benign bone island. IMPRESSION: Normal alignment and no acute bony findings or significant degenerative changes. Electronically Signed   By: Marijo Sanes M.D.   On: 09/01/2020 15:03    Procedures Procedures   Medications Ordered in ED Medications - No data to display  ED Course  I have reviewed the triage vital signs and the nursing notes.  Pertinent labs & imaging results that were available during my care of the patient were reviewed by me and considered in my medical decision making (see chart for details).    MDM Rules/Calculators/A&P                          Patient is presenting with mid back pain after mechanical fall this past weekend.  He has no neurologic symptoms or deficits.  No focal midline tenderness.  Lumbar spine film was ordered in triage, however pain is located in the thoracic region.  T-spine film is negative for evidence of acute fracture.  Patient is not in any acute distress.  We will prescribe muscle relaxer, recommend topical Voltaren, ice, rest, NSAIDs, Tylenol.  PCP follow-up if symptoms persist.  Strict return precautions  Discussed results, findings, treatment and follow up. Patient advised of return precautions. Patient verbalized understanding and agreed with plan.  Final Clinical Impression(s) / ED Diagnoses Final diagnoses:  Back pain    Rx / DC Orders ED Discharge Orders         Ordered    cyclobenzaprine (FLEXERIL) 10 MG tablet  2 times daily PRN        09/01/20 1641           Antonella Upson,  [place] N, PA-C 09/01/20 1643    Hayden Rasmussen, MD 09/02/20 0900

## 2021-01-27 ENCOUNTER — Telehealth: Payer: Self-pay | Admitting: Cardiology

## 2021-01-27 MED ORDER — METOPROLOL TARTRATE 100 MG PO TABS: 100.0000 mg | ORAL_TABLET | Freq: Two times a day (BID) | ORAL | 3 refills | Status: DC

## 2021-01-27 MED ORDER — LISINOPRIL 20 MG PO TABS: 20.0000 mg | ORAL_TABLET | Freq: Every day | ORAL | 3 refills | Status: DC

## 2021-01-27 NOTE — Telephone Encounter (Signed)
*  STAT* If patient is at the pharmacy, call can be transferred to refill team.   1. Which medications need to be refilled? (please list name of each medication and dose if known)  lisinopril (ZESTRIL) 20 MG tablet metoprolol tartrate (LOPRESSOR) 100 MG tablet (Expired)  2. Which pharmacy/location (including street and city if local pharmacy) is medication to be sent to? [facility] [id number] - [place], [address]  3. Do they need a 30 day or 90 day supply?  90 day supply

## 2021-02-08 ENCOUNTER — Other Ambulatory Visit: Payer: Self-pay

## 2021-02-08 ENCOUNTER — Ambulatory Visit (INDEPENDENT_AMBULATORY_CARE_PROVIDER_SITE_OTHER): Payer: BLUE CROSS/BLUE SHIELD | Admitting: Dermatology

## 2021-02-08 ENCOUNTER — Encounter: Payer: Self-pay | Admitting: Dermatology

## 2021-02-08 DIAGNOSIS — L304 Erythema intertrigo: Secondary | ICD-10-CM

## 2021-02-08 DIAGNOSIS — Z808 Family history of malignant neoplasm of other organs or systems: Secondary | ICD-10-CM

## 2021-02-08 DIAGNOSIS — Z1283 Encounter for screening for malignant neoplasm of skin: Secondary | ICD-10-CM | POA: Diagnosis not present

## 2021-02-26 ENCOUNTER — Encounter: Payer: Self-pay | Admitting: Dermatology

## 2021-02-26 NOTE — Progress Notes (Signed)
   Follow-Up Visit   Subjective  Ryan Williamson is a 45 y.o. male who presents for the following: Annual Exam (Patient here today for yearly skin check, per patient he has a rough patch on his posterior neck x 2-3 weeks no treatment, no itching just scabbing over. Patient also states that he would like a refill on Pramasone that he uses in his groin area in the winter time. No personal history or atypical moles, melanoma or non mole skin cancer. Family history of non mole skin cancer. ).  General skin examination, check back of neck Location:  Duration:  Quality:  Associated Signs/Symptoms: Modifying Factors:  Severity:  Timing: Context:   Objective  Well appearing patient in no apparent distress; mood and affect are within normal limits. Head Full body skin check no personal history of skin cancer or atypia. Post neck area he has picked its just a few days old ok otc hydrocortisone ointment   Pubic History of recurring groin rash/itching in the winter which is helped by prescription pramasone    A full examination was performed including scalp, head, eyes, ears, nose, lips, neck, chest, axillae, abdomen, back, buttocks, bilateral upper extremities, bilateral lower extremities, hands, feet, fingers, toes, fingernails, and toenails. All findings within normal limits unless otherwise noted below.  Areas beneath undergarment not fully examined   Assessment & Plan    Screening exam for skin cancer Head  Keep yearly skin exams.  If neck lesion is not healed and 1 to 2 months, will recheck  Erythema intertrigo Pubic  He will look for over-the-counter lotions containing pramoxine; he may also use over-the-counter hydrocortisone.  If these fail we will okay prednisone by MyChart or phone.      I, Lavonna Monarch, MD, have reviewed all documentation for this visit.  The documentation on 02/26/21 for the exam, diagnosis, procedures, and orders are all accurate and complete.

## 2021-03-31 ENCOUNTER — Ambulatory Visit: Payer: BLUE CROSS/BLUE SHIELD | Admitting: Cardiology

## 2021-04-26 ENCOUNTER — Ambulatory Visit: Payer: BLUE CROSS/BLUE SHIELD | Admitting: Cardiology

## 2021-04-30 ENCOUNTER — Emergency Department (HOSPITAL_BASED_OUTPATIENT_CLINIC_OR_DEPARTMENT_OTHER): Payer: BLUE CROSS/BLUE SHIELD

## 2021-04-30 ENCOUNTER — Emergency Department (HOSPITAL_BASED_OUTPATIENT_CLINIC_OR_DEPARTMENT_OTHER)
Admission: EM | Admit: 2021-04-30 | Discharge: 2021-04-30 | Disposition: A | Discharge status: Home / Self Care | Payer: BLUE CROSS/BLUE SHIELD | Attending: Emergency Medicine | Admitting: Emergency Medicine

## 2021-04-30 ENCOUNTER — Other Ambulatory Visit: Payer: Self-pay

## 2021-04-30 ENCOUNTER — Encounter (HOSPITAL_BASED_OUTPATIENT_CLINIC_OR_DEPARTMENT_OTHER): Payer: Self-pay

## 2021-04-30 DIAGNOSIS — R55 Syncope and collapse: Secondary | ICD-10-CM | POA: Diagnosis present

## 2021-04-30 DIAGNOSIS — E876 Hypokalemia: Secondary | ICD-10-CM | POA: Diagnosis not present

## 2021-04-30 DIAGNOSIS — R61 Generalized hyperhidrosis: Secondary | ICD-10-CM | POA: Insufficient documentation

## 2021-04-30 DIAGNOSIS — E860 Dehydration: Secondary | ICD-10-CM | POA: Insufficient documentation

## 2021-04-30 DIAGNOSIS — M79605 Pain in left leg: Secondary | ICD-10-CM | POA: Insufficient documentation

## 2021-04-30 DIAGNOSIS — Z79899 Other long term (current) drug therapy: Secondary | ICD-10-CM | POA: Diagnosis not present

## 2021-04-30 DIAGNOSIS — R000 Tachycardia, unspecified: Secondary | ICD-10-CM | POA: Insufficient documentation

## 2021-04-30 DIAGNOSIS — F419 Anxiety disorder, unspecified: Secondary | ICD-10-CM | POA: Diagnosis not present

## 2021-04-30 DIAGNOSIS — I10 Essential (primary) hypertension: Secondary | ICD-10-CM | POA: Diagnosis not present

## 2021-04-30 HISTORY — DX: Essential (primary) hypertension: I10

## 2021-04-30 LAB — TROPONIN I (HIGH SENSITIVITY)
Troponin I (High Sensitivity): 3 ng/L (ref <18)
Troponin I (High Sensitivity): 4 ng/L (ref <18)

## 2021-04-30 LAB — CBC WITH DIFFERENTIAL/PLATELET
Abs Immature Granulocytes: 0.05 10*3/uL (ref 0.00–0.07)
Basophils Absolute: 0.1 10*3/uL (ref 0.0–0.1)
Basophils Relative: 1 %
Eosinophils Absolute: 0 10*3/uL (ref 0.0–0.5)
Eosinophils Relative: 0 %
HCT: 41.8 % (ref 39.0–52.0)
Hemoglobin: 14.5 g/dL (ref 13.0–17.0)
Immature Granulocytes: 1 %
Lymphocytes Relative: 13 %
Lymphs Abs: 1.3 10*3/uL (ref 0.7–4.0)
MCH: 31.7 pg (ref 26.0–34.0)
MCHC: 34.7 g/dL (ref 30.0–36.0)
MCV: 91.3 fL (ref 80.0–100.0)
Monocytes Absolute: 0.6 10*3/uL (ref 0.1–1.0)
Monocytes Relative: 6 %
Neutro Abs: 7.7 10*3/uL (ref 1.7–7.7)
Neutrophils Relative %: 79 %
Platelets: 359 10*3/uL (ref 150–400)
RBC: 4.58 MIL/uL (ref 4.22–5.81)
RDW: 12 % (ref 11.5–15.5)
WBC: 9.7 10*3/uL (ref 4.0–10.5)
nRBC: 0 % (ref 0.0–0.2)

## 2021-04-30 LAB — COMPREHENSIVE METABOLIC PANEL
ALT: 39 U/L (ref 0–44)
AST: 28 U/L (ref 15–41)
Albumin: 4.2 g/dL (ref 3.5–5.0)
Alkaline Phosphatase: 42 U/L (ref 38–126)
Anion gap: 15 (ref 5–15)
BUN: 12 mg/dL (ref 6–20)
CO2: 18 mmol/L — ABNORMAL LOW (ref 22–32)
Calcium: 9.2 mg/dL (ref 8.9–10.3)
Chloride: 99 mmol/L (ref 98–111)
Creatinine, Ser: 0.95 mg/dL (ref 0.61–1.24)
GFR, Estimated: >60 mL/min (ref >60)
Glucose, Bld: 112 mg/dL — ABNORMAL HIGH (ref 70–99)
Potassium: 3.2 mmol/L — ABNORMAL LOW (ref 3.5–5.1)
Sodium: 132 mmol/L — ABNORMAL LOW (ref 135–145)
Total Bilirubin: 0.7 mg/dL (ref 0.3–1.2)
Total Protein: 7.8 g/dL (ref 6.5–8.1)

## 2021-04-30 LAB — CBG MONITORING, ED: Glucose-Capillary: 115 mg/dL — ABNORMAL HIGH (ref 70–99)

## 2021-04-30 LAB — LIPASE, BLOOD: Lipase: 31 U/L (ref 11–51)

## 2021-04-30 LAB — MAGNESIUM: Magnesium: 1.8 mg/dL (ref 1.7–2.4)

## 2021-04-30 LAB — TSH: TSH: 0.871 u[IU]/mL (ref 0.350–4.500)

## 2021-04-30 MED ORDER — SODIUM CHLORIDE 0.9 % IV BOLUS
1000.0000 mL | Freq: Once | INTRAVENOUS | Status: AC
  Administered 2021-04-30: 1000 mL via INTRAVENOUS

## 2021-04-30 MED ORDER — POTASSIUM CHLORIDE CRYS ER 20 MEQ PO TBCR
40.0000 meq | EXTENDED_RELEASE_TABLET | Freq: Once | ORAL | Status: AC
  Administered 2021-04-30: 40 meq via ORAL
  Filled 2021-04-30: qty 2

## 2021-04-30 MED ORDER — IOHEXOL 350 MG/ML SOLN
100.0000 mL | Freq: Once | INTRAVENOUS | Status: AC | PRN
  Administered 2021-04-30: 100 mL via INTRAVENOUS

## 2021-04-30 NOTE — ED Provider Notes (Signed)
[facility] EMERGENCY DEPARTMENT Provider Note   CSN: [id number] Arrival date & time: 04/30/21  1522     History  Chief Complaint  Patient presents with   Loss of Consciousness    Ryan Williamson is a 46 y.o. male.  The history is provided by the patient and medical records. No language interpreter was used.  Loss of Consciousness Episode history:  Single Most recent episode:  Today Duration:  5 seconds Timing:  Intermittent Progression:  Waxing and waning Chronicity:  New Worsened by:  Nothing Ineffective treatments:  None tried Associated symptoms: anxiety, diaphoresis and malaise/fatigue   Associated symptoms: no chest pain, no confusion, no difficulty breathing, no dizziness, no fever, no focal weakness, no headaches, no nausea, no palpitations, no recent fall, no recent injury, no seizures, no shortness of breath, no vomiting and no weakness       Home Medications Prior to Admission medications   Medication Sig Start Date End Date Taking? Authorizing Provider  lisinopril (ZESTRIL) 20 MG tablet Take 1 tablet (20 mg total) by mouth daily. 01/27/21   Minus Breeding, MD  metoprolol tartrate (LOPRESSOR) 100 MG tablet Take 1 tablet (100 mg total) by mouth 2 (two) times daily. 01/27/21 04/27/21  Minus Breeding, MD      Allergies    Crab (diagnostic), Seasonal ic [cholestatin], and Sulfa antibiotics    Review of Systems   Review of Systems  Constitutional:  Positive for diaphoresis, fatigue and malaise/fatigue. Negative for chills and fever.  HENT:  Negative for congestion.   Eyes:  Negative for photophobia and visual disturbance.  Respiratory:  Negative for cough, chest tightness, shortness of breath and wheezing.   Cardiovascular:  Positive for syncope. Negative for chest pain, palpitations and leg swelling.  Gastrointestinal:  Negative for abdominal pain, constipation, diarrhea, nausea and vomiting.  Genitourinary:  Negative for dysuria.  Musculoskeletal:   Negative for back pain, neck pain and neck stiffness.  Skin:  Negative for rash and wound.  Neurological:  Positive for syncope and light-headedness. Negative for dizziness, focal weakness, seizures, speech difficulty, weakness and headaches.  Psychiatric/Behavioral:  Negative for agitation and confusion.   All other systems reviewed and are negative.  Physical Exam Updated Vital Signs BP 122/75    Pulse 90    Temp 97.7 F (36.5 C) (Oral)    Resp (!) 23    Ht 6\' 4"  (1.93 m)    Wt 115.7 kg    SpO2 99%    BMI 31.04 kg/m  Physical Exam Vitals and nursing note reviewed.  Constitutional:      General: He is not in acute distress.    Appearance: He is well-developed. He is not ill-appearing, toxic-appearing or diaphoretic.  HENT:     Head: Normocephalic and atraumatic.     Nose: No congestion or rhinorrhea.     Mouth/Throat:     Mouth: Mucous membranes are dry.     Pharynx: No oropharyngeal exudate or posterior oropharyngeal erythema.  Eyes:     Extraocular Movements: Extraocular movements intact.     Conjunctiva/sclera: Conjunctivae normal.     Pupils: Pupils are equal, round, and reactive to light.  Cardiovascular:     Rate and Rhythm: Regular rhythm. Tachycardia present.     Pulses: Normal pulses.     Heart sounds: No murmur heard. Pulmonary:     Effort: Pulmonary effort is normal. No respiratory distress.     Breath sounds: Normal breath sounds. No wheezing, rhonchi or rales.  Chest:  Chest wall: No tenderness.  Abdominal:     General: Abdomen is flat.     Palpations: Abdomen is soft.     Tenderness: There is no abdominal tenderness. There is no right CVA tenderness, left CVA tenderness, guarding or rebound.  Musculoskeletal:        General: No swelling or tenderness.     Cervical back: Neck supple. No tenderness.     Right lower leg: No edema.     Left lower leg: No edema.  Skin:    General: Skin is warm and dry.     Capillary Refill: Capillary refill takes less than  2 seconds.  Neurological:     General: No focal deficit present.     Mental Status: He is alert and oriented to person, place, and time. Mental status is at baseline.     Cranial Nerves: No cranial nerve deficit.     Sensory: No sensory deficit.     Motor: No weakness.     Coordination: Coordination normal.  Psychiatric:        Mood and Affect: Mood normal.    ED Results / Procedures / Treatments   Labs (all labs ordered are listed, but only abnormal results are displayed) Labs Reviewed  COMPREHENSIVE METABOLIC PANEL - Abnormal; Notable for the following components:      Result Value   Sodium 132 (*)    Potassium 3.2 (*)    CO2 18 (*)    Glucose, Bld 112 (*)    All other components within normal limits  CBG MONITORING, ED - Abnormal; Notable for the following components:   Glucose-Capillary 115 (*)    All other components within normal limits  CBC WITH DIFFERENTIAL/PLATELET  LIPASE, BLOOD  MAGNESIUM  TSH  TROPONIN I (HIGH SENSITIVITY)  TROPONIN I (HIGH SENSITIVITY)    EKG EKG Interpretation  Date/Time:  Sunday April 30 2021 15:29:36 EST Ventricular Rate:  99 PR Interval:  140 QRS Duration: 94 QT Interval:  372 QTC Calculation: 477 R Axis:   73 Text Interpretation: Normal sinus rhythm Left ventricular hypertrophy with repolarization abnormality ( R in aVL ) Inferior infarct , age undetermined Abnormal ECG No previous ECGs available No prior ECG for comparison. S1Q3T3 pattern. No STEMI Confirmed by Antony Blackbird [phone]) on 04/30/2021 3:32:41 PM  Radiology CT Head Wo Contrast  Result Date: 04/30/2021 CLINICAL DATA:  Dizziness, persistent/recurrent, cardiac or vascular cause suspected Strange sensation in head followed by syncopal episode. Also recent left leg pain, sensitive dread, and syncopal episode with diaphoresis. EXAM: CT HEAD WITHOUT CONTRAST TECHNIQUE: Contiguous axial images were obtained from the base of the skull through the vertex without intravenous  contrast. RADIATION DOSE REDUCTION: This exam was performed according to the departmental dose-optimization program which includes automated exposure control, adjustment of the mA and/or kV according to patient size and/or use of iterative reconstruction technique. COMPARISON:  None. BRAIN: BRAIN Trace patchy and confluent areas of decreased attenuation are noted throughout the deep and periventricular white matter of the cerebral hemispheres bilaterally, compatible with chronic microvascular ischemic disease. No evidence of large-territorial acute infarction. No parenchymal hemorrhage. No mass lesion. No extra-axial collection. No mass effect or midline shift. No hydrocephalus. Basilar cisterns are patent. Vascular: No hyperdense vessel. Skull: No acute fracture or focal lesion. Sinuses/Orbits: Bilateral maxillary mucosal thickening. Otherwise paranasal sinuses and mastoid air cells are clear. The orbits are unremarkable. Other: None. IMPRESSION: No acute intracranial abnormality with trace chronic microvascular ischemic changes. Electronically Signed   By: Thomasena Edis  Mckinley Jewel M.D.   On: 04/30/2021 17:31   CT Angio Chest PE W and/or Wo Contrast  Result Date: 04/30/2021 CLINICAL DATA:  Pulmonary embolism (PE) suspected, high prob EXAM: CT ANGIOGRAPHY CHEST WITH CONTRAST TECHNIQUE: Multidetector CT imaging of the chest was performed using the standard protocol during bolus administration of intravenous contrast. Multiplanar CT image reconstructions and MIPs were obtained to evaluate the vascular anatomy. RADIATION DOSE REDUCTION: This exam was performed according to the departmental dose-optimization program which includes automated exposure control, adjustment of the mA and/or kV according to patient size and/or use of iterative reconstruction technique. CONTRAST:  141mL OMNIPAQUE IOHEXOL 350 MG/ML SOLN COMPARISON:  None. FINDINGS: Cardiovascular: Second scan satisfactory opacification of the pulmonary arteries to  the segmental level. No evidence of pulmonary embolism. Normal heart size. No significant pericardial effusion. The thoracic aorta is normal in caliber. No atherosclerotic plaque of the thoracic aorta. No coronary artery calcifications. Mediastinum/Nodes: 1.5 cm right paratracheal lymph node (10:51). No enlarged hilar or axillary lymph nodes. Thyroid gland, trachea, and esophagus demonstrate no significant findings. Lungs/Pleura: No focal consolidation. No acute abnormality. No pulmonary mass. No pleural effusion. No pneumothorax. Upper Abdomen: No acute abnormality. Musculoskeletal: No chest wall abnormality. No suspicious lytic or blastic osseous lesions. No acute displaced fracture. Multilevel degenerative changes of the spine. Review of the MIP images confirms the above findings. IMPRESSION: 1. No pulmonary embolus. 2. Nonspecific 1.5 cm right paratracheal lymph node. 3. No acute intrapulmonary abnormality. Electronically Signed   By: Iven Finn M.D.   On: 04/30/2021 17:37   US Venous Img Lower Unilateral Left  Result Date: 04/30/2021 CLINICAL DATA:  Left leg pain for 2 days EXAM: LEFT LOWER EXTREMITY VENOUS DOPPLER ULTRASOUND TECHNIQUE: Gray-scale sonography with compression, as well as color and duplex ultrasound, were performed to evaluate the deep venous system(s) from the level of the common femoral vein through the popliteal and proximal calf veins. COMPARISON:  None. FINDINGS: VENOUS Normal compressibility of the common femoral, superficial femoral, and popliteal veins, as well as the visualized calf veins. Visualized portions of profunda femoral vein and great saphenous vein unremarkable. No filling defects to suggest DVT on grayscale or color Doppler imaging. Doppler waveforms show normal direction of venous flow, normal respiratory plasticity and response to augmentation. Limited views of the contralateral common femoral vein are unremarkable. OTHER None. Limitations: none IMPRESSION: 1. No  evidence of deep venous thrombosis within the left lower extremity. Electronically Signed   By: Randa Ngo M.D.   On: 04/30/2021 17:46    Procedures Procedures    Medications Ordered in ED Medications  sodium chloride 0.9 % bolus 1,000 mL ( Intravenous Stopped 04/30/21 1825)  iohexol (OMNIPAQUE) 350 MG/ML injection 100 mL (100 mLs Intravenous Contrast Given 04/30/21 1704)  potassium chloride SA (KLOR-CON M) CR tablet 40 mEq (40 mEq Oral Given 04/30/21 2012)    ED Course/ Medical Decision Making/ A&P                           Medical Decision Making  Ryan Williamson is a 46 y.o. male with a past medical history significant for hypertension, previous diverticulitis, and SVT who presents for syncopal episode today.  According to patient, he is very anxious as a close family friend recently passed away from a stroke.  He says that for the last few days he is felt "off" and anxious and foggy headed.  He reports that he had some left calf pain  yesterday that resolved today but after eating lunch today had a episode of dread that something bad was happening and then he got lightheaded, diaphoretic, and passed out.  He only was fully unconscious for several seconds or started waking up.  He was denying any chest pain or palpitations and denies shortness of breath but was feeling like something was wrong.  He denies any true dizziness, vision changes, or speech abnormalities.  He does not think he hit his head.  He says that he is not taking his medication for blood pressure with metoprolol as directed.  He has made some recent dietary changes and is taking GoLo dietary supplements to help with weight loss.  He has only been on this for a week or 2.  On exam, patient is very anxious.  He is tachycardic.  He is not hypotensive and had normal oxygen saturations.  Lungs were clear and chest was nontender.  Abdomen was nontender.  Good pulses in extremities.  Left calf was nontender and had good pulses in  his lower extremities.  Reports the leg symptoms have resolved.  I did not appreciate any focal neurologic deficits with normal sensation, strength, and finger-nose-finger testing bilaterally.  Symmetric smile.  Pupils symmetric reactive normal extract movements.  Symmetric smile.  Clear speech.  EKG showed S1Q3T3 pattern but there is no prior for comparison.  Given the unilateral leg pain, sense of dread and doom, and diaphoresis with syncopal episode IM extremely concerned that patient may have a DVT or PE.  He denied recent travel and no history of this but I do feel he needs CT PE study as well as leg ultrasound.  We will get labs including troponin.  We will get electrolytes given the supplement he is been taking.  For the foggy headedness and lightheadedness and feeling that something was going wrong in his head, and going to the ground it is reasonable to get a head CT to look for acute abnormality.  Given his lack of focal neurologic deficits or dizziness, will hold on vascular imaging of the head and neck.  He has no neck pain or headache, doubt dissection or aneurysm at this time.  Will continue patient on the monitor and will give some fluids for the tachycardia.  We discussed that this could be anxiety and panic attack in the setting of recent dietary changes but I agree with patient and family that we need to rule out some of these concerning etiologies first.  Anticipate reassessment after work-up to determine disposition.  Work-up returned and was overall reassuring.  No evidence of DVT or PE on ultrasound and CT scan.  CT head showed no acute abnormality.  Chronic microvascular changes seen which we discussed.  Troponin negative x2.  Other electrolytes reassuring with magnesium.  He is sodium and potassium were slightly low.  Fluids and potassium given.  CBC reassuring.  TSH in process which she will follow-up with his PCP about.  Patient was observed for nearly 6 hours and had  improvement in symptoms.  As we have ruled out any concerning cause of his syncopal episode and his vital signs have been reassuring here, we do feel he is safe for discharge home however we recommend he follow-up with his cardiologist and PCP and maintain hydration at home.  Patient agrees and understood return precautions.  He had no questions or concerns and was discharged in good condition after otherwise reassuring work-up.         Final Clinical  Impression(s) / ED Diagnoses Final diagnoses:  Syncope, unspecified syncope type  Dehydration  Hypokalemia  Near syncope    Rx / DC Orders ED Discharge Orders     None       Clinical Impression: 1. Syncope, unspecified syncope type   2. Dehydration   3. Hypokalemia   4. Near syncope     Disposition: Discharge  Condition: Good  I have discussed the results, Dx and Tx plan with the pt(& family if present). He/she/they expressed understanding and agree(s) with the plan. Discharge instructions discussed at great length. Strict return precautions discussed and pt &/or family have verbalized understanding of the instructions. No further questions at time of discharge.    New Prescriptions   No medications on file    Follow Up: London Pepper, MD [address] [place] [address] [phone]     [place] [address] [id number] [place] DXAJ [place]       Linda Grimmer, Gwenyth Allegra, MD 04/30/21 2120

## 2021-04-30 NOTE — ED Triage Notes (Addendum)
Pt arrives with c/o passing out about 30 minutes PTA. Pt states he has not felt right for the past few days, States that he has been having chills. Pt states his wife was with him during this episode but canceled the EMS before they came. Pt states they had gone to lunch, he felt off, on the way home he got sweaty, tumbled, did not have complete LOC per wife. After episode passed he asked his wife to bring him to ED. Pt reports 2 days of not feeling well states he has felt anxious and foggy. Also reports feeling lightheaded, states he took his BP Medication early today. Also wife reports c/o pain in calf. EKG obtained in triage.

## 2021-04-30 NOTE — ED Notes (Signed)
Patient given ice water for PO challenge.

## 2021-04-30 NOTE — Discharge Instructions (Addendum)
Your history, exam, work-up today led [place] to do imaging and work-up to look for multiple causes of your lightheadedness and syncopal episode.  The ultrasound and CT scan showed no evidence of blood clots in the leg or lungs and the CT of the head did not show evidence of acute stroke.  Your labs did show a slightly low potassium and I suspect some dehydration.  No evidence of acute infection and labs are otherwise reassuring.  Given your stability for nearly 6 hours and otherwise reassuring exam, we agree you appear safe for discharge home but do recommend you following up with your PCP and a cardiologist given the passing out episode.  Please rest and stay hydrated and if any symptoms change or worsen over the next few days, please return to the nearest emergency department.

## 2021-04-30 NOTE — ED Notes (Signed)
Patient discharged to home.  All discharge instructions reviewed.  Patient verbalized understanding via teachback method.  VS WDL.  Respirations even and unlabored.  Ambulatory out of ED.   °

## 2021-04-30 NOTE — ED Notes (Signed)
Pt not in room, unable to obtain vitals.

## 2021-05-10 DIAGNOSIS — R072 Precordial pain: Secondary | ICD-10-CM | POA: Insufficient documentation

## 2021-05-10 DIAGNOSIS — R55 Syncope and collapse: Secondary | ICD-10-CM | POA: Insufficient documentation

## 2021-05-10 NOTE — Progress Notes (Deleted)
Cardiology Office Note   Date:  05/10/2021   ID:  Seraphim, Trow Jul 08, 1975, MRN [id number]  PCP:  London Pepper, MD  Cardiologist:   Minus Breeding, MD   No chief complaint on file.     History of Present Illness: Ryan Williamson is a 46 y.o. male who presents for follow up of palpitations and chest discomfort.   Coronary calcium score was zero in March of last year.   He was in the ED last week with syncope.  I reviewed these records for this visit.    CT was done to rule out PE and this was negative although there was an enlarged paratracheal lymph node.  He had Dopplers negative for DVT.  He was thought to be dehydrated.  ***  ***  .  He called today and was having some dull chest discomfort.  He says this has been a left shoulder discomfort.  Its been since mid December.  He says he feels like he cannot take a deep breath sometimes.  The discomfort is there all the time.  He took burning but waxes and wanes in intensity.  He feels a little more intense when he tries to take a deep breath.  Advil may have helped.  It does not really hurt with movement.  He is a little bit confused because he does have a bad rotator cuff on that side.  He has been able to be active and still rides a bike and has not brought this on with not activity.  He is not having any associated nausea vomiting or diaphoresis.  There is no PND or orthopnea.  He has had no palpitations, presyncope or syncope.  Past Medical History:  Diagnosis Date   Diverticulitis    Hypertension    SVT (supraventricular tachycardia) ([place]) 2013   Diagnosed at the [facility]    Past Surgical History:  Procedure Laterality Date   ABDOMINAL SURGERY       Current Outpatient Medications  Medication Sig Dispense Refill   lisinopril (ZESTRIL) 20 MG tablet Take 1 tablet (20 mg total) by mouth daily. 90 tablet 3   metoprolol tartrate (LOPRESSOR) 100 MG tablet Take 1 tablet (100 mg total) by mouth 2 (two) times daily.  180 tablet 3   No current facility-administered medications for this visit.    Allergies:   Crab (diagnostic), Seasonal ic [cholestatin], and Sulfa antibiotics    ROS:  Please see the history of present illness.   Otherwise, review of systems are positive for ***.   All other systems are reviewed and negative.    PHYSICAL EXAM: VS:  There were no vitals taken for this visit. , BMI There is no height or weight on file to calculate BMI. GENERAL:  Well appearing NECK:  No jugular venous distention, waveform within normal limits, carotid upstroke brisk and symmetric, no bruits, no thyromegaly LUNGS:  Clear to auscultation bilaterally CHEST:  Unremarkable HEART:  PMI not displaced or sustained,S1 and S2 within normal limits, no S3, no S4, no clicks, no rubs, *** murmurs ABD:  Flat, positive bowel sounds normal in frequency in pitch, no bruits, no rebound, no guarding, no midline pulsatile mass, no hepatomegaly, no splenomegaly EXT:  2 plus pulses throughout, no edema, no cyanosis no clubbing   ***GENERAL:  Well appearing NECK:  No jugular venous distention, waveform within normal limits, carotid upstroke brisk and symmetric, no bruits, no thyromegaly LUNGS:  Clear to auscultation bilaterally CHEST:  Unremarkable  HEART:  PMI not displaced or sustained,S1 and S2 within normal limits, no S3, no S4, no clicks, no rubs, no murmurs ABD:  Flat, positive bowel sounds normal in frequency in pitch, no bruits, no rebound, no guarding, no midline pulsatile mass, no hepatomegaly, no splenomegaly EXT:  2 plus pulses throughout, no edema, no cyanosis no clubbing   EKG:  EKG is *** ordered today. The ekg ordered today demonstrates sinus rhythm, rate ***, axis within normal limits, intervals within normal limits, no acute ST-T wave changes.   Recent Labs: 04/30/2021: ALT 39; BUN 12; Creatinine, Ser 0.95; Hemoglobin 14.5; Magnesium 1.8; Platelets 359; Potassium 3.2; Sodium 132; TSH 0.871    Lipid  Panel No results found for: CHOL, TRIG, HDL, CHOLHDL, VLDL, LDLCALC, LDLDIRECT    Wt Readings from Last 3 Encounters:  04/30/21 255 lb (115.7 kg)  09/01/20 260 lb (117.9 kg)  04/22/20 270 lb (122.5 kg)      Other studies Reviewed: Additional studies/ records that were reviewed today include: ***. Review of the above records demonstrates:  NA  ASSESSMENT AND PLAN:  SYNCOPE:  ***   SVT:  ***   Has had no further tachypalpitations.  No change in therapy.  CHEST PAIN:     ***   He has chest or shoulder pain.  I am going to start with a coronary calcium score.  If this is abnormal he will need a POET (Plain Old Exercise Treadmill)  HTN:  His blood pressure is *** controlled.  No change in therapy.  ENLARGED LYMPH NODE:  ***    RISK REDUCTION: His LDL *** in 2019 was 135 with an HDL of 58.  I will repeat this calcium score..    Current medicines are reviewed at length with the patient today.  The patient does not have concerns regarding medicines.  The following changes have been made:  None  Labs/ tests ordered today include:   None  No orders of the defined types were placed in this encounter.    Disposition:   FU with me in 12 months.     Signed, Minus Breeding, MD  05/10/2021 3:54 PM    [place]

## 2021-05-11 ENCOUNTER — Other Ambulatory Visit: Payer: Self-pay | Admitting: Family Medicine

## 2021-05-11 ENCOUNTER — Ambulatory Visit: Payer: BLUE CROSS/BLUE SHIELD | Admitting: Cardiology

## 2021-05-11 DIAGNOSIS — R590 Localized enlarged lymph nodes: Secondary | ICD-10-CM

## 2021-06-09 ENCOUNTER — Ambulatory Visit: Payer: BLUE CROSS/BLUE SHIELD | Admitting: Cardiology

## 2021-06-21 ENCOUNTER — Ambulatory Visit: Payer: BLUE CROSS/BLUE SHIELD | Admitting: Cardiology

## 2021-08-09 ENCOUNTER — Other Ambulatory Visit: Payer: BLUE CROSS/BLUE SHIELD

## 2021-08-13 NOTE — Progress Notes (Signed)
?  ?Cardiology Office Note ? ? ?Date:  08/14/2021  ? ?ID:  Ryan Williamson, DOB 09-14-1975, MRN [id number] ? ?PCP:  London Pepper, MD  ?Cardiologist:   Minus Breeding, MD ? ? ?Chief Complaint  ?Patient presents with  ? Loss of Consciousness  ? ? ?  ?History of Present Illness: ?Ryan Williamson is a 46 y.o. male who presents for follow up of palpitations and syncope.  He had chest pain and had a zero calcium score.   He was in the ED in Jan.     He was thought to be dehydrated. CT demonstrated no pulmonary embolism.  I reviewed these records for this visit.  Of note he was hypokalemic and he did get some potassium supplementation on that day.  His magnesium was slightly low as well. ? ?He is unfortunately going through quite a bit of stress.  He had to drive to a thick in the [place] frequently because his mother is in hospice.  She has Parkinson's.  He denies any new syncopal episodes since the January event.  He says that on that day he was driving and he got out and he felt lightheaded prior to getting up and then he passed out for a few seconds.  He has not had that happen before or since.  He is not having orthostatic symptoms.  He denies any chest pressure, neck or arm discomfort.  He has had no new shortness of breath, PND or orthopnea.  He has had some rare palpitations.  He feels his heart racing and has to cough and it will go away quickly. ? ?We talked mostly about weight loss strategies.  He is trying some intermittent fasting.  He is actually gained weight without the driving he is having to do.  He is starting to do some walking but is yet to get into a routine.  He is trying to change some of his diet  ? ? ?Past Medical History:  ?Diagnosis Date  ? Diverticulitis   ? Hypertension   ? SVT (supraventricular tachycardia) ([place]) 2013  ? Diagn[facility] at the [facility]  ? ? ?Past Surgical History:  ?Procedure Laterality Date  ? ABDOMINAL SURGERY    ? ? ? ?Current Outpatient Medications  ?Medication Sig Dispense  Refill  ? lisinopril (ZESTRIL) 20 MG tablet Take 1 tablet (20 mg total) by mouth daily. 90 tablet 3  ? metoprolol tartrate (LOPRESSOR) 100 MG tablet Take 1 tablet (100 mg total) by mouth 2 (two) times daily. 180 tablet 3  ? aspirin (ASPIRIN ADULT LOW DOSE) 81 MG EC tablet 1 tablet    ? ?No current facility-administered medications for this visit.  ? ? ?Allergies:   Crab (diagnostic), Naproxen, Seasonal ic [cholestatin], and Sulfa antibiotics  ? ? ?ROS:  Please see the history of present illness.   Otherwise, review of systems are positive for none.   All other systems are reviewed and negative.  ? ? ?PHYSICAL EXAM: ?VS:  BP (!) 126/59   Pulse 76   Ht 6' (1.829 m)   Wt 266 lb (120.7 kg)   SpO2 98%   BMI 36.08 kg/m?  , BMI Body mass index is 36.08 kg/m?. ?GENERAL:  Well appearing ?NECK:  No jugular venous distention, waveform within normal limits, carotid upstroke brisk and symmetric, no bruits, no thyromegaly ?LUNGS:  Clear to auscultation bilaterally ?CHEST:  Unremarkable ?HEART:  PMI not displaced or sustained,S1 and S2 within normal limits, no S3, no S4, no clicks, no  rubs, no murmurs ?ABD:  Flat, positive bowel sounds normal in frequency in pitch, no bruits, no rebound, no guarding, no midline pulsatile mass, no hepatomegaly, no splenomegaly ?EXT:  2 plus pulses throughout, no edema, no cyanosis no clubbing ? ?EKG:  EKG is  not  ordered today. ?The ekg ordered 04/30/2021 demonstrates sinus rhythm, rate 99, axis within normal limits, no acute ST-T wave changes.  QT borderline ? ? ?Recent Labs: ?04/30/2021: ALT 39; BUN 12; Creatinine, Ser 0.95; Hemoglobin 14.5; Magnesium 1.8; Platelets 359; Potassium 3.2; Sodium 132; TSH 0.871  ? ? ?Lipid Panel ?No results found for: CHOL, TRIG, HDL, CHOLHDL, VLDL, LDLCALC, LDLDIRECT ?  ? ?Wt Readings from Last 3 Encounters:  ?08/14/21 266 lb (120.7 kg)  ?04/30/21 255 lb (115.7 kg)  ?09/01/20 260 lb (117.9 kg)  ?  ? ? ?Other studies Reviewed: ?Additional studies/ records that  were reviewed today include: ED records. ?Review of the above records demonstrates: As above ? ?ASSESSMENT AND PLAN: ? ?SVT:    He has short paroxysms of symptomatic palpitations and knows how to control these.  We talked for a while about whether beta-blockers could be adding to fatigue and they could be.  He wondered about having an ablation but he is easily controlled with medications and at this point has elected to continue therapy.  We talked about switching to a calcium channel blocker but he is just going to try to increase his activity and improve his diet before wanting to make a switch to beta-blockers.  He is not convinced that beta-blockers are contributing to tiredness.  ? ?SYNCOPE: This probably was secondary to dehydration.  He had no further episodes. ? ?CHEST PAIN:    He had a 0 calcium score.  I have suggested that he does not need the aspirin for primary prevention and he will consider stopping this.  He has not had any further chest pain.  ? ?HTN:  His blood pressure is controlled.  No change in therapy.   ? ?RISK REDUCTION: His LDL is 106 with an HDL of 70.  No change in therapy.  This was last year and he is due to have this repeated. ? ?OBESITY: We had a long discussion about this and I am going to refer him to [facility].   ? ?Current medicines are reviewed at length with the patient today.  The patient does not have concerns regarding medicines. ? ?The following changes have been made:  None ? ?Labs/ tests ordered today include:   None ? ?Orders Placed This Encounter  ?Procedures  ? Ambulatory referral to Family Practice  ? ? ? ?Disposition:   FU with me in 6 months at his request.   ? ? ?Signed, ?Minus Breeding, MD  ?08/14/2021 8:47 AM    ?[place] ? ? ?

## 2021-08-14 ENCOUNTER — Encounter: Payer: Self-pay | Admitting: Cardiology

## 2021-08-14 ENCOUNTER — Ambulatory Visit (INDEPENDENT_AMBULATORY_CARE_PROVIDER_SITE_OTHER): Payer: BLUE CROSS/BLUE SHIELD | Admitting: Cardiology

## 2021-08-14 VITALS — BP 126/59 | HR 76 | Ht 72.0 in | Wt 266.0 lb

## 2021-08-14 DIAGNOSIS — R55 Syncope and collapse: Secondary | ICD-10-CM | POA: Diagnosis not present

## 2021-08-14 DIAGNOSIS — I10 Essential (primary) hypertension: Secondary | ICD-10-CM

## 2021-08-14 DIAGNOSIS — E7800 Pure hypercholesterolemia, unspecified: Secondary | ICD-10-CM

## 2021-08-14 DIAGNOSIS — I471 Supraventricular tachycardia: Secondary | ICD-10-CM | POA: Diagnosis not present

## 2021-08-14 NOTE — Patient Instructions (Signed)
?  Follow-Up: At [facility], you and your health needs are our priority.  As part of our continuing mission to provide you with exceptional heart care, we have created designated Provider Care Teams.  These Care Teams include your primary Cardiologist (physician) and Advanced Practice Providers (APPs -  Physician Assistants and Nurse Practitioners) who all work together to provide you with the care you need, when you need it.  We recommend signing up for the patient portal called "MyChart".  Sign up information is provided on this After Visit Summary.  MyChart is used to connect with patients for Virtual Visits (Telemedicine).  Patients are able to view lab/test results, encounter notes, upcoming appointments, etc.  Non-urgent messages can be sent to your provider as well.   To learn more about what you can do with MyChart, go to [email or web address].    Your next appointment:   6 month(s)  The format for your next appointment:   In Person  Provider:   James Hochrein, MD       Important Information About Sugar       

## 2021-08-31 ENCOUNTER — Other Ambulatory Visit: Payer: BLUE CROSS/BLUE SHIELD

## 2021-09-25 ENCOUNTER — Other Ambulatory Visit: Payer: BLUE CROSS/BLUE SHIELD

## 2021-10-23 ENCOUNTER — Other Ambulatory Visit: Payer: BLUE CROSS/BLUE SHIELD

## 2021-11-14 ENCOUNTER — Ambulatory Visit
Admission: RE | Admit: 2021-11-14 | Discharge: 2021-11-14 | Disposition: A | Discharge status: Home / Self Care | Payer: BLUE CROSS/BLUE SHIELD | Source: Ambulatory Visit | Attending: Family Medicine | Admitting: Family Medicine

## 2021-11-14 DIAGNOSIS — R590 Localized enlarged lymph nodes: Secondary | ICD-10-CM

## 2022-01-18 ENCOUNTER — Telehealth: Payer: Self-pay | Admitting: Cardiology

## 2022-01-18 ENCOUNTER — Other Ambulatory Visit: Payer: Self-pay | Admitting: Cardiology

## 2022-01-18 ENCOUNTER — Other Ambulatory Visit: Payer: Self-pay

## 2022-01-18 MED ORDER — METOPROLOL TARTRATE 100 MG PO TABS: 100.0000 mg | ORAL_TABLET | Freq: Two times a day (BID) | ORAL | 3 refills | Status: DC

## 2022-01-18 MED ORDER — LISINOPRIL 20 MG PO TABS: 20.0000 mg | ORAL_TABLET | Freq: Every day | ORAL | 3 refills | Status: DC

## 2022-01-18 NOTE — Telephone Encounter (Signed)
RX sent

## 2022-01-18 NOTE — Telephone Encounter (Signed)
*  STAT* If patient is at the pharmacy, call can be transferred to refill team.   1. Which medications need to be refilled? (please list name of each medication and dose if known)   metoprolol tartrate (LOPRESSOR) 100 MG tablet (Expired) lisinopril (ZESTRIL) 20 MG tablet  2. Which pharmacy/location (including street and city if local pharmacy) is medication to be sent to?  [facility] [id number] - [place], [address]  3. Do they need a 30 day or 90 day supply?  90 days  Patient only has 2 days worth of medication left.  Patient noted he will be going out of town this evening.

## 2022-01-19 ENCOUNTER — Other Ambulatory Visit: Payer: Self-pay | Admitting: Cardiology

## 2022-02-12 ENCOUNTER — Ambulatory Visit: Payer: BLUE CROSS/BLUE SHIELD | Admitting: Dermatology

## 2022-02-16 ENCOUNTER — Ambulatory Visit: Payer: BLUE CROSS/BLUE SHIELD | Admitting: Cardiology

## 2022-04-17 ENCOUNTER — Telehealth: Payer: Self-pay | Admitting: Cardiology

## 2022-04-17 ENCOUNTER — Encounter: Payer: Self-pay | Admitting: Cardiology

## 2022-04-17 NOTE — Telephone Encounter (Signed)
Patient stated that on 12/30, he had "PVCs" most of the day. He had eye surgery on 12/18 and was placed on difluprednate, which he is being weaned from. During this call, he stated he can still feel the irregular beats. He will send [facility] tracings to MyChart. This AM BP 150/90, P 79. While on phone, BP 151/94, P 78. Patient denies SOB, chest pain, dizziness/lightheadedness, edema, N-V, sweating. Patient is taking meds as prescribed. He just started back on ASA after eye surgery.

## 2022-04-17 NOTE — Telephone Encounter (Signed)
Patient c/o Palpitations:  High priority if patient c/o lightheadedness, shortness of breath, or chest pain  How long have you had palpitations/irregular HR/ Afib? Are you having the symptoms now? new years it started, yes but not as bad  Are you currently experiencing lightheadedness, SOB or CP? no  Do you have a history of afib (atrial fibrillation) or irregular heart rhythm? yes  Have you checked your BP or HR? (document readings if available): [email or web address] says normal sinus rhythm   Are you experiencing any other symptoms? No   Patient states he has been having more frequent PVC's than normal and it has been lasting a while. He says he is not having any other symptoms.

## 2022-04-18 ENCOUNTER — Telehealth: Payer: Self-pay | Admitting: Cardiology

## 2022-04-18 NOTE — Telephone Encounter (Signed)
Spoke to patient stated he is having frequent palpitations.He sent Kardia EKG strips to my chart.Advised strips are normal.Advised to avoid caffeine.Advised to keep appointment already scheduled with Florene Glen NP 1/4 at 8:25 am.

## 2022-04-18 NOTE — Progress Notes (Unsigned)
Cardiology Office Note:    Date:  04/19/2022   ID:  Ryan Williamson, DOB Sep 06, 1975, MRN [id number]  PCP:  London Pepper, MD   [facility] Providers Cardiologist:  Minus Breeding, MD     Referring MD: London Pepper, MD   Chief Complaint: frequent PVCs  History of Present Illness:    Ryan Williamson is a very pleasant 47 y.o. male with a hx of SVT, palpitations, syncope, calcium score of 0, HTN, and obesity. SVT diagnosed at [facility] in 2013.    ED visit 04/30/2021 for syncopal episode.  He reported passing out after standing up and getting lightheaded, and diaphoretic.  Had been feeling off the previous few days due to a close family friend had recently passed away from a stroke.  This had made him very anxious.  At the time he was taking Golo dietary supplements to help with weight loss.  No evidence of DVT or PE improvement with fluids and potassium.   Last cardiology clinic visit was 08/14/21 with Dr. Percival Spanish. No further episodes of presyncope, syncope. He reported short paroxysms of symptomatic palpitations which he knows how to control. Discussion regarding whether beta-blockers were adding to fatigue. Also discussed switching to calcium channel blocker.  He wanted to increase his activity and improve his diet before making a medication change. Was referred to [facility] for management of obesity. Was advised he could stop asa for primary prevention of CAD.  Advised to return in 6 months for follow-up.  Today, he is here for evaluation of irregular HR. Started to notice missed heart beats on 12/30. In the past, when this occurs it usually resolves quickly but on this occasion symptoms lasted 36 hours. Used his Kardia monitor and received report that rhythm was SVT. He tried valsalva maneuvers and continued his metoprolol without improvement. He denies factors that would have exacerbated symptoms such as increased caffeine or alcohol and no other symptoms of  respiratory or GI illness. Admits that maybe holidays contributed to stress.  Travels frequently for his job. Will be leaving for [place] in about 10 days and will be gone for 2 weeks. BP usually 130s, will slightly increase when he is concerned about heart rhythm. According to his wife, he rarely snores. Has been aware of low potassium in the past and tries to consume foods high in K+. Continues to struggle with the desire to lose weight and exercise on a regular basis. Could not get an appointment with [facility] due to his frequent travel schedule.  He denies chest pain, shortness of breath, orthopnea, PND, lightheadedness, syncope.  Past Medical History:  Diagnosis Date   Diverticulitis    Hypertension    SVT (supraventricular tachycardia) 2013   Diagnosed at the [facility]    Past Surgical History:  Procedure Laterality Date   ABDOMINAL SURGERY      Current Medications: Current Meds  Medication Sig   aspirin (ASPIRIN ADULT LOW DOSE) 81 MG EC tablet 1 tablet   diltiazem (CARDIZEM) 30 MG tablet Take 1 tablet (30 mg total) by mouth 4 (four) times daily. As needed for palpitations.   lisinopril (ZESTRIL) 20 MG tablet Take 1 tablet (20 mg total) by mouth daily.   metoprolol tartrate (LOPRESSOR) 100 MG tablet Take 1 tablet (100 mg total) by mouth 2 (two) times daily.     Allergies:   Crab (diagnostic), Naproxen, Seasonal ic [cholestatin], and Sulfa antibiotics   Social History   Socioeconomic  History   Marital status: Single    Spouse name: Not on file   Number of children: Not on file   Years of education: Not on file   Highest education level: Not on file  Occupational History   Not on file  Tobacco Use   Smoking status: Never   Smokeless tobacco: Never  Vaping Use   Vaping Use: Never used  Substance and Sexual Activity   Alcohol use: Yes   Drug use: Never   Sexual activity: Not on file  Other Topics Concern   Not on file  Social History Narrative    Not on file   Social Determinants of Health   Financial Resource Strain: Not on file  Food Insecurity: Not on file  Transportation Needs: Not on file  Physical Activity: Not on file  Stress: Not on file  Social Connections: Not on file     Family History: The patient's family history includes Abnormal EKG in his mother; Hypokalemia in his mother.  ROS:   Please see the history of present illness.    + palpitations All other systems reviewed and are negative.  Labs/Other Studies Reviewed:    The following studies were reviewed today:  CCTA 06/14/20 Coronary arteries: Normal origins   IMPRESSION: Coronary calcium score of 0. This is a low risk study.  Recent Labs: 04/30/2021: ALT 39; BUN 12; Creatinine, Ser 0.95; Hemoglobin 14.5; Magnesium 1.8; Platelets 359; Potassium 3.2; Sodium 132; TSH 0.871  Recent Lipid Panel No results found for: "CHOL", "TRIG", "HDL", "CHOLHDL", "VLDL", "LDLCALC", "LDLDIRECT"   Risk Assessment/Calculations:       Physical Exam:    VS:  BP 120/84   Pulse 65   Ht 6' (1.829 m)   Wt 265 lb 12.8 oz (120.6 kg)   SpO2 98%   BMI 36.05 kg/m     Wt Readings from Last 3 Encounters:  04/19/22 265 lb 12.8 oz (120.6 kg)  08/14/21 266 lb (120.7 kg)  04/30/21 255 lb (115.7 kg)     GEN:  Well nourished, well developed in no acute distress HEENT: Normal NECK: No JVD; No carotid bruits CARDIAC: RRR, no murmurs, rubs, gallops RESPIRATORY:  Clear to auscultation without rales, wheezing or rhonchi  ABDOMEN: Soft, non-tender, non-distended MUSCULOSKELETAL:  No edema; No deformity. 2+ pedal pulses, equal bilaterally SKIN: Warm and dry NEUROLOGIC:  Alert and oriented x 3 PSYCHIATRIC:  Normal affect   EKG:  EKG is ordered today.  The ekg ordered today demonstrates normal sinus rhythm at 65 bpm, inferior TWI, no acute change from previous tracing   Diagnoses:    1. Frequent PVCs   2. Essential hypertension   3. Elevated LDL cholesterol level   4.  Palpitations   5. Class 2 obesity due to excess calories without serious comorbidity with body mass index (BMI) of 36.0 to 36.9 in adult    Assessment and Plan:     Palpitations/Frequent PVCs: Symptomatic palpitations 12/30-12/31/23. Cardia monitor strips from 04/17/2022 reviewed and reveal frequent PVCs. No PVCs on EKG or exam today. He will continue to monitor with Jodelle Red and report additional concerning episodes. We will get an echocardiogram to evaluate for structural heart disease. Will add diltiazem 30 mg up to 4 times daily as needed for palpitations. Continue Lopressor 100 mg twice daily. Will check BMP.  Discussed potential potassium supplement if he remains hypokalemic. Continue to consume high potassium foods.   Hyperlipidemia: LDL 106 on 04/2020. We will recheck today.  Encouraged healthy lifestyle including  150 minutes of moderate intensity exercise each week, heart healthy, mostly plant-based diet and weight loss.  Essential hypertension: BP is well controlled.    Obesity: Discussion about barriers to weight loss and lack of exercise. He is aware of what he needs to do just lacks motivation to be consistent. Encouraged seeking healthy options during travel and information on healthy lifestyle provided.      Disposition: 3 months with me  Medication Adjustments/Labs and Tests Ordered: Current medicines are reviewed at length with the patient today.  Concerns regarding medicines are outlined above.  Orders Placed This Encounter  Procedures   Basic Metabolic Panel (BMET)   Lipid Profile   EKG 12-Lead   ECHOCARDIOGRAM COMPLETE   Meds ordered this encounter  Medications   diltiazem (CARDIZEM) 30 MG tablet    Sig: Take 1 tablet (30 mg total) by mouth 4 (four) times daily. As needed for palpitations.    Dispense:  30 tablet    Refill:  6    Patient Instructions  Medication Instructions:   START Diltiazem one (1) tablet by mouth ( 30 mg) 4 times daily as needed for  palpitations.   *If you need a refill on your cardiac medications before your next appointment, please call your pharmacy*   Lab Work:  TODAY!!!! LIPID/BMET  If you have labs (blood work) drawn today and your tests are completely normal, you will receive your results only by: [place] (if you have MyChart) OR A paper copy in the mail If you have any lab test that is abnormal or we need to change your treatment, we will call you to review the results.   Testing/Procedures:  Your physician has requested that you have an echocardiogram. Echocardiography is a painless test that uses sound waves to create images of your heart. It provides your doctor with information about the size and shape of your heart and how well your heart's chambers and valves are working. This procedure takes approximately one hour. There are no restrictions for this procedure. Please do NOT wear cologne, aftershave, or lotions (deodorant is allowed). Please arrive 15 minutes prior to your appointment time.    Follow-Up: At [facility], you and your health needs are our priority.  As part of our continuing mission to provide you with exceptional heart care, we have created designated Provider Care Teams.  These Care Teams include your primary Cardiologist (physician) and Advanced Practice Providers (APPs -  Physician Assistants and Nurse Practitioners) who all work together to provide you with the care you need, when you need it.  We recommend signing up for the patient portal called "MyChart".  Sign up information is provided on this After Visit Summary.  MyChart is used to connect with patients for Virtual Visits (Telemedicine).  Patients are able to view lab/test results, encounter notes, upcoming appointments, etc.  Non-urgent messages can be sent to your provider as well.   To learn more about what you can do with MyChart, go to [email or web address].    Your next appointment:   2  month(s)  The format for your next appointment:   In Person  Provider:   Christen Bame, NP         Other Instructions  Adopting a Healthy Lifestyle.   Weight: Know what a healthy weight is for you (roughly BMI <25) and aim to maintain this. You can calculate your body mass index on your smart phone  Diet: Aim for 7+ servings of fruits and vegetables daily  Limit animal fats in diet for cholesterol and heart health - choose grass fed whenever available Avoid highly processed foods (fast food burgers, tacos, fried chicken, pizza, hot dogs, french fries)  Saturated fat comes in the form of butter, lard, coconut oil, margarine, partially hydrogenated oils, and fat in meat. These increase your risk of cardiovascular disease.  Use healthy plant oils, such as olive, canola, soy, corn, sunflower and peanut.  Whole foods such as fruits, vegetables and whole grains have fiber  Men need > 38 grams of fiber per day Women need > 25 grams of fiber per day  Load up on vegetables and fruits - one-half of your plate: Aim for color and variety, and remember that potatoes dont count. Go for whole grains - one-quarter of your plate: Whole wheat, barley, wheat berries, quinoa, oats, brown rice, and foods made with them. If you want pasta, go with whole wheat pasta. Protein power - one-quarter of your plate: Fish, chicken, beans, and nuts are all healthy, versatile protein sources. Limit red meat. You need carbohydrates for energy! The type of carbohydrate is more important than the amount. Choose carbohydrates such as vegetables, fruits, whole grains, beans, and nuts in the place of white rice, white pasta, potatoes (baked or fried), macaroni and cheese, cakes, cookies, and donuts.  If youre thirsty, drink water. Coffee and tea are good in moderation, but skip sugary drinks and limit milk and dairy products to one or two daily servings. Keep sugar intake at 6 teaspoons or 24 grams or LESS        Exercise: Aim for 150 min of moderate intensity exercise weekly for heart health, and weights twice weekly for bone health Stay active - any steps are better than no steps! Aim for 7-9 hours of sleep daily        Important Information About Sugar         Signed, Emmaline Life, NP  04/19/2022 9:03 AM    [place]

## 2022-04-18 NOTE — Telephone Encounter (Signed)
Patient c/o Palpitations:  High priority if patient c/o lightheadedness, shortness of breath, or chest pain  How long have you had palpitations/irregular HR/ Afib? Are you having the symptoms now? SVT- he is having it at his moment  Are you currently experiencing lightheadedness, SOB or CP? no  Do you have a history of afib (atrial fibrillation) or irregular heart rhythm?   Have you checked your BP or HR? (document readings if available):   Are you experiencing any other symptoms? No symptoms

## 2022-04-19 ENCOUNTER — Ambulatory Visit: Payer: BLUE CROSS/BLUE SHIELD | Attending: Cardiology | Admitting: Nurse Practitioner

## 2022-04-19 ENCOUNTER — Encounter: Payer: Self-pay | Admitting: Nurse Practitioner

## 2022-04-19 VITALS — BP 120/84 | HR 65 | Ht 72.0 in | Wt 265.8 lb

## 2022-04-19 DIAGNOSIS — E6609 Other obesity due to excess calories: Secondary | ICD-10-CM

## 2022-04-19 DIAGNOSIS — R002 Palpitations: Secondary | ICD-10-CM | POA: Diagnosis not present

## 2022-04-19 DIAGNOSIS — I10 Essential (primary) hypertension: Secondary | ICD-10-CM

## 2022-04-19 DIAGNOSIS — Z6836 Body mass index (BMI) 36.0-36.9, adult: Secondary | ICD-10-CM

## 2022-04-19 DIAGNOSIS — E7800 Pure hypercholesterolemia, unspecified: Secondary | ICD-10-CM

## 2022-04-19 DIAGNOSIS — I493 Ventricular premature depolarization: Secondary | ICD-10-CM

## 2022-04-19 LAB — BASIC METABOLIC PANEL
BUN/Creatinine Ratio: 14 (ref 9–20)
BUN: 12 mg/dL (ref 6–24)
CO2: 25 mmol/L (ref 20–29)
Calcium: 9.9 mg/dL (ref 8.7–10.2)
Chloride: 100 mmol/L (ref 96–106)
Creatinine, Ser: 0.84 mg/dL (ref 0.76–1.27)
Glucose: 93 mg/dL (ref 70–99)
Potassium: 4.8 mmol/L (ref 3.5–5.2)
Sodium: 137 mmol/L (ref 134–144)
eGFR: 110 mL/min/{1.73_m2} (ref >59)

## 2022-04-19 LAB — LIPID PANEL
Chol/HDL Ratio: 3.7 ratio (ref 0.0–5.0)
Cholesterol, Total: 223 mg/dL — ABNORMAL HIGH (ref 100–199)
HDL: 60 mg/dL (ref >39)
LDL Chol Calc (NIH): 120 mg/dL — ABNORMAL HIGH (ref 0–99)
Triglycerides: 250 mg/dL — ABNORMAL HIGH (ref 0–149)
VLDL Cholesterol Cal: 43 mg/dL — ABNORMAL HIGH (ref 5–40)

## 2022-04-19 MED ORDER — DILTIAZEM HCL 30 MG PO TABS: 30.0000 mg | ORAL_TABLET | Freq: Four times a day (QID) | ORAL | 6 refills | Status: DC

## 2022-04-19 NOTE — Patient Instructions (Signed)
Medication Instructions:   START Diltiazem one (1) tablet by mouth ( 30 mg) 4 times daily as needed for palpitations.   *If you need a refill on your cardiac medications before your next appointment, please call your pharmacy*   Lab Work:  TODAY!!!! LIPID/BMET  If you have labs (blood work) drawn today and your tests are completely normal, you will receive your results only by: [place] (if you have MyChart) OR A paper copy in the mail If you have any lab test that is abnormal or we need to change your treatment, we will call you to review the results.   Testing/Procedures:  Your physician has requested that you have an echocardiogram. Echocardiography is a painless test that uses sound waves to create images of your heart. It provides your doctor with information about the size and shape of your heart and how well your heart's chambers and valves are working. This procedure takes approximately one hour. There are no restrictions for this procedure. Please do NOT wear cologne, aftershave, or lotions (deodorant is allowed). Please arrive 15 minutes prior to your appointment time.    Follow-Up: At [facility], you and your health needs are our priority.  As part of our continuing mission to provide you with exceptional heart care, we have created designated Provider Care Teams.  These Care Teams include your primary Cardiologist (physician) and Advanced Practice Providers (APPs -  Physician Assistants and Nurse Practitioners) who all work together to provide you with the care you need, when you need it.  We recommend signing up for the patient portal called "MyChart".  Sign up information is provided on this After Visit Summary.  MyChart is used to connect with patients for Virtual Visits (Telemedicine).  Patients are able to view lab/test results, encounter notes, upcoming appointments, etc.  Non-urgent messages can be sent to your provider as well.   To learn more about  what you can do with MyChart, go to [email or web address].    Your next appointment:   2 month(s)  The format for your next appointment:   In Person  Provider:   Christen Bame, NP         Other Instructions  Adopting a Healthy Lifestyle.   Weight: Know what a healthy weight is for you (roughly BMI <25) and aim to maintain this. You can calculate your body mass index on your smart phone  Diet: Aim for 7+ servings of fruits and vegetables daily Limit animal fats in diet for cholesterol and heart health - choose grass fed whenever available Avoid highly processed foods (fast food burgers, tacos, fried chicken, pizza, hot dogs, french fries)  Saturated fat comes in the form of butter, lard, coconut oil, margarine, partially hydrogenated oils, and fat in meat. These increase your risk of cardiovascular disease.  Use healthy plant oils, such as olive, canola, soy, corn, sunflower and peanut.  Whole foods such as fruits, vegetables and whole grains have fiber  Men need > 38 grams of fiber per day Women need > 25 grams of fiber per day  Load up on vegetables and fruits - one-half of your plate: Aim for color and variety, and remember that potatoes dont count. Go for whole grains - one-quarter of your plate: Whole wheat, barley, wheat berries, quinoa, oats, brown rice, and foods made with them. If you want pasta, go with whole wheat pasta. Protein power - one-quarter of your plate: Fish, chicken, beans, and nuts are all healthy, versatile protein sources. Limit  red meat. You need carbohydrates for energy! The type of carbohydrate is more important than the amount. Choose carbohydrates such as vegetables, fruits, whole grains, beans, and nuts in the place of white rice, white pasta, potatoes (baked or fried), macaroni and cheese, cakes, cookies, and donuts.  If youre thirsty, drink water. Coffee and tea are good in moderation, but skip sugary drinks and limit milk and dairy products to one  or two daily servings. Keep sugar intake at 6 teaspoons or 24 grams or LESS       Exercise: Aim for 150 min of moderate intensity exercise weekly for heart health, and weights twice weekly for bone health Stay active - any steps are better than no steps! Aim for 7-9 hours of sleep daily        Important Information About Sugar

## 2022-04-24 ENCOUNTER — Ambulatory Visit: Payer: BLUE CROSS/BLUE SHIELD | Admitting: Cardiology

## 2022-05-16 ENCOUNTER — Other Ambulatory Visit (HOSPITAL_COMMUNITY): Payer: BLUE CROSS/BLUE SHIELD

## 2022-06-12 ENCOUNTER — Other Ambulatory Visit: Payer: Self-pay | Admitting: Nurse Practitioner

## 2022-06-13 ENCOUNTER — Other Ambulatory Visit (HOSPITAL_COMMUNITY): Payer: BLUE CROSS/BLUE SHIELD

## 2022-06-23 NOTE — Progress Notes (Deleted)
Cardiology Office Note:    Date:  06/23/2022   ID:  Ryan Williamson, DOB Jun 05, 1975, MRN [id number]  PCP:  Ryan Pepper, MD   [facility] Providers Cardiologist:  Ryan Breeding, MD     Referring MD: Ryan Pepper, MD   Chief Complaint: frequent PVCs  History of Present Illness:    Ryan Williamson is a very pleasant 47 y.o. male with a hx of SVT, palpitations, syncope, calcium score of 0, HTN, and obesity. SVT diagnosed at [facility] clinic in 2013.    ED visit 04/30/2021 for syncopal episode.  He reported passing out after standing up and getting lightheaded, and diaphoretic.  Had been feeling off the previous few days due to a close family friend had recently passed away from a stroke.  This had made him very anxious.  At the time he was taking Golo dietary supplements to help with weight loss.  No evidence of DVT or PE improvement with fluids and potassium.   Last cardiology clinic visit was 08/14/21 with Dr. Percival Williamson. No further episodes of presyncope, syncope. He reported short paroxysms of symptomatic palpitations which he knows how to control. Discussion regarding whether beta-blockers were adding to fatigue. Also discussed switching to calcium channel blocker.  He wanted to increase his activity and improve his diet before making a medication change. Was referred to [facility] of obesity. Was advised he could stop asa for primary prevention of CAD.  Advised to return in 6 months for follow-up.  Today, he is here for evaluation of irregular HR. Started to notice missed heart beats on 12/30. In the past, when this occurs it usually resolves quickly but on this occasion symptoms lasted 36 hours. Used his Kardia monitor and received report that rhythm was SVT. He tried valsalva maneuvers and continued his metoprolol without improvement. He denies factors that would have exacerbated symptoms such as increased caffeine or alcohol and no other symptoms of  respiratory or GI illness. Admits that maybe holidays contributed to stress.  Travels frequently for his job. Will be leaving for [place] in about 10 days and will be gone for 2 weeks. BP usually 130s, will slightly increase when he is concerned about heart rhythm. According to his wife, he rarely snores. Has been aware of low potassium in the past and tries to consume foods high in K+. Continues to struggle with the desire to lose weight and exercise on a regular basis. Could not get an appointment with [facility] due to his frequent travel schedule.  He denies chest pain, shortness of breath, orthopnea, PND, lightheadedness, syncope.  Past Medical History:  Diagnosis Date   Diverticulitis    Hypertension    SVT (supraventricular tachycardia) 2013   Diagnosed at the [facility]    Past Surgical History:  Procedure Laterality Date   ABDOMINAL SURGERY      Current Medications: No outpatient medications have been marked as taking for the 06/28/22 encounter (Appointment) with Ryan Williamson, Ryan Schwab, NP.     Allergies:   Crab (diagnostic), Naproxen, Seasonal ic [cholestatin], and Sulfa antibiotics   Social History   Socioeconomic History   Marital status: Single    Spouse name: Not on file   Number of children: Not on file   Years of education: Not on file   Highest education level: Not on file  Occupational History   Not on file  Tobacco Use   Smoking status: Never   Smokeless tobacco: Never  Vaping Use   Vaping Use: Never used  Substance and Sexual Activity   Alcohol use: Yes   Drug use: Never   Sexual activity: Not on file  Other Topics Concern   Not on file  Social History Narrative   Not on file   Social Determinants of Health   Financial Resource Strain: Not on file  Food Insecurity: Not on file  Transportation Needs: Not on file  Physical Activity: Not on file  Stress: Not on file  Social Connections: Not on file     Family History: The  patient's family history includes Abnormal EKG in his mother; Hypokalemia in his mother.  ROS:   Please see the history of present illness.    + palpitations All other systems reviewed and are negative.  Labs/Other Studies Reviewed:    The following studies were reviewed today:  CCTA 06/14/20 Coronary arteries: Normal origins   IMPRESSION: Coronary calcium score of 0. This is a low risk study.  Recent Labs: 04/19/2022: BUN 12; Creatinine, Ser 0.84; Potassium 4.8; Sodium 137  Recent Lipid Panel    Component Value Date/Time   CHOL 223 (H) 04/19/2022 0912   TRIG 250 (H) 04/19/2022 0912   HDL 60 04/19/2022 0912   CHOLHDL 3.7 04/19/2022 0912   LDLCALC 120 (H) 04/19/2022 0912     Risk Assessment/Calculations:       Physical Exam:    VS:  There were no vitals taken for this visit.    Wt Readings from Last 3 Encounters:  04/19/22 265 lb 12.8 oz (120.6 kg)  08/14/21 266 lb (120.7 kg)  04/30/21 255 lb (115.7 kg)     GEN:  Well nourished, well developed in no acute distress HEENT: Normal NECK: No JVD; No carotid bruits CARDIAC: RRR, no murmurs, rubs, gallops RESPIRATORY:  Clear to auscultation without rales, wheezing or rhonchi  ABDOMEN: Soft, non-tender, non-distended MUSCULOSKELETAL:  No edema; No deformity. 2+ pedal pulses, equal bilaterally SKIN: Warm and dry NEUROLOGIC:  Alert and oriented x 3 PSYCHIATRIC:  Normal affect   EKG:  EKG is ***   Diagnoses:    No diagnosis found.  Assessment and Plan:     Palpitations/Frequent PVCs: Symptomatic palpitations 12/30-12/31/23. Cardia monitor strips from 04/17/2022 reviewed and reveal frequent PVCs. No PVCs on EKG or exam today. He will continue to monitor with Ryan Williamson and report additional concerning episodes. We will get an echocardiogram to evaluate for structural heart disease. Will add diltiazem 30 mg up to 4 times daily as needed for palpitations. Continue Lopressor 100 mg twice daily. Will check BMP.  Discussed  potential potassium supplement if he remains hypokalemic. Continue to consume high potassium foods.   Hyperlipidemia: LDL 106 on 04/2020. We will recheck today.  Encouraged healthy lifestyle including 150 minutes of moderate intensity exercise each week, heart healthy, mostly plant-based diet and weight loss.  Essential hypertension: BP is well controlled.    Obesity: Discussion about barriers to weight loss and lack of exercise. He is aware of what he needs to do just lacks motivation to be consistent. Encouraged seeking healthy options during travel and information on healthy lifestyle provided.      Disposition: ***  Medication Adjustments/Labs and Tests Ordered: Current medicines are reviewed at length with the patient today.  Concerns regarding medicines are outlined above.  No orders of the defined types were placed in this encounter.  No orders of the defined types were placed in this encounter.   There are no Patient Instructions on  file for this visit.   Signed, Emmaline Life, NP  06/23/2022 5:19 PM    [place]

## 2022-06-25 NOTE — Telephone Encounter (Signed)
-----   Message from Emmaline Life, NP sent at 06/23/2022  5:20 PM EST ----- We can reschedule his appointment with me for after his echo unless he is having concerning symptoms.  Thanks!

## 2022-06-25 NOTE — Telephone Encounter (Signed)
Lvm pt did not need upcoming appt until after Echo in April unless pt was having symptoms.  Moved pts appt till after echo and sent pt mychart message. Held slot for this week for pt if needed.

## 2022-06-28 ENCOUNTER — Ambulatory Visit: Payer: BLUE CROSS/BLUE SHIELD | Admitting: Nurse Practitioner

## 2022-07-18 ENCOUNTER — Ambulatory Visit (HOSPITAL_COMMUNITY): Payer: BLUE CROSS/BLUE SHIELD

## 2022-07-24 ENCOUNTER — Ambulatory Visit: Payer: BLUE CROSS/BLUE SHIELD | Admitting: Nurse Practitioner

## 2022-08-08 NOTE — Progress Notes (Deleted)
Cardiology Office Note:    Date:  08/08/2022   ID:  Ryan Williamson, DOB [birth year], MRN [id number]  PCP:  Farris Has, MD   [facility] Providers Cardiologist:  Rollene Rotunda, MD     Referring MD: Farris Has, MD   Chief Complaint: frequent PVCs  History of Present Illness:    Ryan Williamson is a very pleasant 47 y.o. male with a hx of SVT, palpitations, syncope, calcium score of 0, HTN, and obesity. SVT diagnosed at [facility] clinic in 2013.    ED visit 04/30/2021 for syncopal episode.  He reported passing out after standing up and getting lightheaded, and diaphoretic.  Had been feeling off the previous few days due to a close family friend had recently passed away from a stroke.  This had made him very anxious.  At the time he was taking Golo dietary supplements to help with weight loss.  No evidence of DVT or PE improvement with fluids and potassium.   Seen in cardiology clinic 08/14/21 by Dr. Antoine Poche. No further episodes of presyncope, syncope. He reported short paroxysms of symptomatic palpitations which he knows how to control. Discussion regarding whether beta-blockers were adding to fatigue. Also discussed switching to calcium channel blocker.  He wanted to increase his activity and improve his diet before making a medication change. Was referred to [facility] for management of obesity. Was advised he could stop asa for primary prevention of CAD.  Advised to return in 6 months for follow-up.  Seen in cardiology clinic by me on 04/19/22 for evaluation of irregular HR. Started to notice missed heart beats on 12/30. In the past, when this occurs it usually resolves quickly but on this occasion symptoms lasted 36 hours. Used his Kardia monitor and received report that rhythm was SVT. He tried valsalva maneuvers and continued his metoprolol without improvement. No factors that would have exacerbated symptoms such as increased caffeine or alcohol and no other symptoms  of respiratory or GI illness. Admits that maybe holidays contributed to stress.  Travels frequently for his job. Will be leaving for [place] in about 10 days and will be gone for 2 weeks. BP usually 130s, will slightly increase when he is concerned about heart rhythm. According to his wife, he rarely snores. Has been aware of low potassium in the past and tries to consume foods high in K+. Continues to struggle with the desire to lose weight and exercise on a regular basis. Could not get an appointment with [facility] due to his frequent travel schedule. Not having any chest pain, shortness of breath, orthopnea, PND, lightheadedness, syncope. Plan to get echo to evaluate for structural heart disease. He was given diltiazem 30 mg to take as needed for palpitations. Lipid panel that day revealed LDL 120, trigs 250, HDL 60. He was encouraged to aggressively work on diet, exercise and weight loss to reduce ASCVD risk.   Today,   Past Medical History:  Diagnosis Date   Diverticulitis    Hypertension    SVT (supraventricular tachycardia) 2013   Diagn[facility] at [facility]    Past Surgical History:  Procedure Laterality Date   ABDOMINAL SURGERY      Current Medications: No outpatient medications have been marked as taking for the 08/16/22 encounter (Appointment) with Lissa Hoard, Zachary George, NP.     Allergies:   Crab (diagnostic), Naproxen, Seasonal ic [cholestatin], and Sulfa antibiotics   Social History   Socioeconomic History   Marital  status: Single    Spouse name: Not on file   Number of children: Not on file   Years of education: Not on file   Highest education level: Not on file  Occupational History   Not on file  Tobacco Use   Smoking status: Never   Smokeless tobacco: Never  Vaping Use   Vaping Use: Never used  Substance and Sexual Activity   Alcohol use: Yes   Drug use: Never   Sexual activity: Not on file  Other Topics Concern   Not on file  Social  History Narrative   Not on file   Social Determinants of Health   Financial Resource Strain: Not on file  Food Insecurity: Not on file  Transportation Needs: Not on file  Physical Activity: Not on file  Stress: Not on file  Social Connections: Not on file     Family History: The patient's family history includes Abnormal EKG in his mother; Hypokalemia in his mother.  ROS:   Please see the history of present illness.    + palpitations All other systems reviewed and are negative.  Labs/Other Studies Reviewed:    The following studies were reviewed today:  CCTA 06/14/20 Coronary arteries: Normal origins   IMPRESSION: Coronary calcium score of 0. This is a low risk study.  Recent Labs: 04/19/2022: BUN 12; Creatinine, Ser 0.84; Potassium 4.8; Sodium 137  Recent Lipid Panel    Component Value Date/Time   CHOL 223 (H) 04/19/2022 0912   TRIG 250 (H) 04/19/2022 0912   HDL 60 04/19/2022 0912   CHOLHDL 3.7 04/19/2022 0912   LDLCALC 120 (H) 04/19/2022 0912     Risk Assessment/Calculations:       Physical Exam:    VS:  There were no vitals taken for this visit.    Wt Readings from Last 3 Encounters:  04/19/22 265 lb 12.8 oz (120.6 kg)  08/14/21 266 lb (120.7 kg)  04/30/21 255 lb (115.7 kg)     GEN:  Well nourished, well developed in no acute distress HEENT: Normal NECK: No JVD; No carotid bruits CARDIAC: RRR, no murmurs, rubs, gallops RESPIRATORY:  Clear to auscultation without rales, wheezing or rhonchi  ABDOMEN: Soft, non-tender, non-distended MUSCULOSKELETAL:  No edema; No deformity. 2+ pedal pulses, equal bilaterally SKIN: Warm and dry NEUROLOGIC:  Alert and oriented x 3 PSYCHIATRIC:  Normal affect   EKG:  EKG is ordered today.  The ekg ordered today demonstrates normal sinus rhythm at 65 bpm, inferior TWI, no acute change from previous tracing   Diagnoses:    No diagnosis found.  Assessment and Plan:     Palpitations/Frequent PVCs: Symptomatic  palpitations 12/30-12/31/23. Cardia monitor strips from 04/17/2022 reviewed and reveal frequent PVCs. No PVCs on EKG or exam today. He will continue to monitor with Lourena Simmonds and report additional concerning episodes. We will get an echocardiogram to evaluate for structural heart disease. Will add diltiazem 30 mg up to 4 times daily as needed for palpitations. Continue Lopressor 100 mg twice daily. Will check BMP.  Discussed potential potassium supplement if he remains hypokalemic. Continue to consume high potassium foods.   Hyperlipidemia: LDL 106 on 04/2020. We will recheck today.  Encouraged healthy lifestyle including 150 minutes of moderate intensity exercise each week, heart healthy, mostly plant-based diet and weight loss.  Essential hypertension: BP is well controlled.    Obesity: Discussion about barriers to weight loss and lack of exercise. He is aware of what he needs to do just lacks motivation  to be consistent. Encouraged seeking healthy options during travel and information on healthy lifestyle provided.      Disposition: ***  Medication Adjustments/Labs and Tests Ordered: Current medicines are reviewed at length with the patient today.  Concerns regarding medicines are outlined above.  No orders of the defined types were placed in this encounter.  No orders of the defined types were placed in this encounter.   There are no Patient Instructions on file for this visit.   Signed, Levi Aland, NP  08/08/2022 1:50 PM    [facility]

## 2022-08-16 ENCOUNTER — Ambulatory Visit: Payer: BLUE CROSS/BLUE SHIELD | Admitting: Nurse Practitioner

## 2022-08-21 ENCOUNTER — Encounter (HOSPITAL_COMMUNITY): Payer: Self-pay

## 2022-08-21 ENCOUNTER — Ambulatory Visit (HOSPITAL_COMMUNITY): Payer: BLUE CROSS/BLUE SHIELD | Attending: Nurse Practitioner

## 2022-08-27 ENCOUNTER — Encounter (HOSPITAL_COMMUNITY): Payer: Self-pay | Admitting: Nurse Practitioner

## 2022-10-15 ENCOUNTER — Ambulatory Visit: Payer: BLUE CROSS/BLUE SHIELD | Admitting: Nurse Practitioner

## 2022-12-02 ENCOUNTER — Other Ambulatory Visit: Payer: Self-pay | Admitting: Nurse Practitioner

## 2022-12-12 NOTE — Progress Notes (Deleted)
  Cardiology Office Note:  .   Date:  12/12/2022  ID:  Ryan Williamson, DOB [birth year], MRN [id number] PCP: Farris Has, MD  [facility] Providers Cardiologist:  Rollene Rotunda, MD { Click to update primary MD,subspecialty MD or APP then REFRESH:1}   Patient Profile: .      PMH Palpitations SVT Syncope Coronary calcium score of 0 Hypertension Obesity  SVT initially diagnosed at [facility] clinic in 2013.  ED visit 04/30/2021 for syncopal episode.  He reported passing out after standing up and getting lightheaded and diaphoretic.  Had been feeling off the previous few days due to a close family friend had recently passed away from a stroke.  This made him very anxious.  At the time he was taking Golo dietary supplements to help with weight loss.  No evidence of DVT or PE on imaging and improvement with fluids and potassium.         History of Present Illness: .   Ryan Williamson is a *** 47 y.o. male ***  ROS: ***       Studies Reviewed: .        *** Risk Assessment/Calculations:   {Does this patient have ATRIAL FIBRILLATION?[phone]} No BP recorded.  {Refresh Note OR Click here to enter BP  :1}***       Physical Exam:   VS:  There were no vitals taken for this visit.   Wt Readings from Last 3 Encounters:  04/19/22 265 lb 12.8 oz (120.6 kg)  08/14/21 266 lb (120.7 kg)  04/30/21 255 lb (115.7 kg)    GEN: Well nourished, well developed in no acute distress NECK: No JVD; No carotid bruits CARDIAC: ***RRR, no murmurs, rubs, gallops RESPIRATORY:  Clear to auscultation without rales, wheezing or rhonchi  ABDOMEN: Soft, non-tender, non-distended EXTREMITIES:  No edema; No deformity     ASSESSMENT AND PLAN: .   ***    {Are you ordering a CV Procedure (e.g. stress test, cath, DCCV, TEE, etc)?   Press F2        :[id number]}  Dispo: ***  Signed, Eligha Bridegroom, NP-C

## 2022-12-25 ENCOUNTER — Ambulatory Visit: Payer: BLUE CROSS/BLUE SHIELD | Admitting: Nurse Practitioner

## 2022-12-31 ENCOUNTER — Telehealth: Payer: Self-pay | Admitting: Cardiology

## 2022-12-31 ENCOUNTER — Other Ambulatory Visit: Payer: Self-pay | Admitting: Cardiology

## 2022-12-31 MED ORDER — METOPROLOL TARTRATE 100 MG PO TABS: 100.0000 mg | ORAL_TABLET | Freq: Two times a day (BID) | ORAL | 1 refills | Status: DC

## 2022-12-31 MED ORDER — LISINOPRIL 20 MG PO TABS: 20.0000 mg | ORAL_TABLET | Freq: Every day | ORAL | 1 refills | Status: DC

## 2022-12-31 NOTE — Telephone Encounter (Signed)
*  STAT* If patient is at the pharmacy, call can be transferred to refill team.   1. Which medications need to be refilled? (please list name of each medication and dose if known)   metoprolol tartrate (LOPRESSOR) 100 MG tablet  lisinopril (ZESTRIL) 20 MG tablet   2. Would you like to learn more about the convenience, safety, & potential cost savings by using the [facility]?   3. Are you open to using the [facility] (Type [facility]. ).  4. Which pharmacy/location (including street and city if local pharmacy) is medication to be sent to?  [facility] [id number] - [place], [place] - [address]   5. Do they need a 30 day or 90 day supply?   90 day  Patient stated he is completely out of these medications.  Patient has appointment scheduled on 12/9.

## 2022-12-31 NOTE — Telephone Encounter (Signed)
Pt's medications were sent to pt's pharmacy as requested. Confirmation received.  

## 2023-01-13 IMAGING — CT CT ANGIO CHEST
2 of 16 series · 15 of 38 positions shown · IV contrast (Omnipaque)
Comparison: None.
COMPARISON: None.

Addendum:
CLINICAL DATA: Pulmonary embolism (PE) suspected, high prob

EXAM:
CT ANGIOGRAPHY CHEST WITH CONTRAST
TECHNIQUE: Multidetector CT imaging of the chest was performed using the
standard protocol during bolus administration of intravenous
contrast. Multiplanar CT image reconstructions and MIPs were
obtained to evaluate the vascular anatomy.

[Series 10: pe thins · axial · 0.83mm/px · z∈[-195,+10]mm · 7 of 275 slices shown (1 of 2)]
[im 35/275  lung]
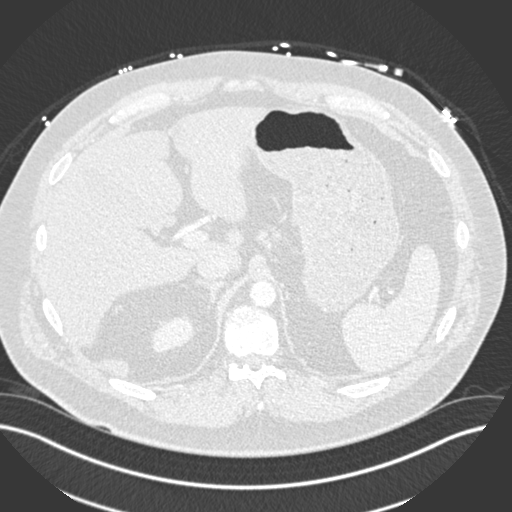
[im 69/275  lung]
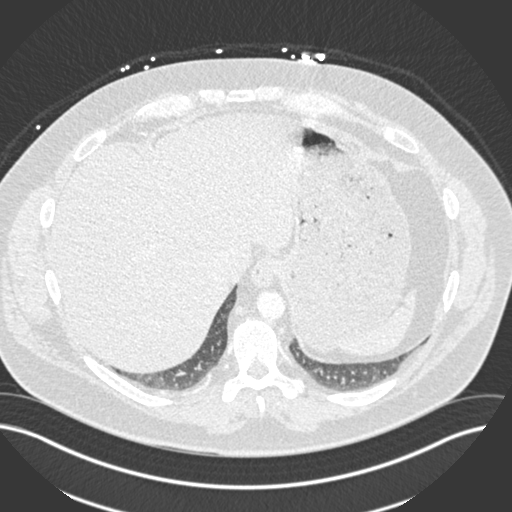
[im 103/275  lung]
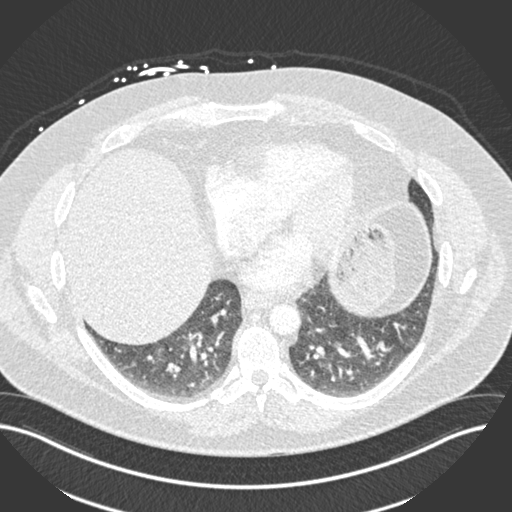
[im 138/275  lung]
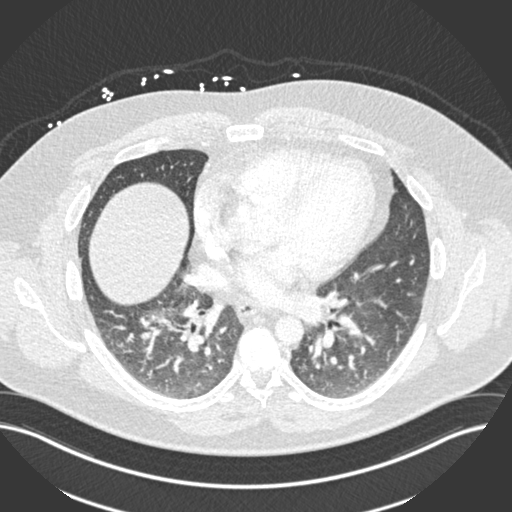
[im 172/275  lung]
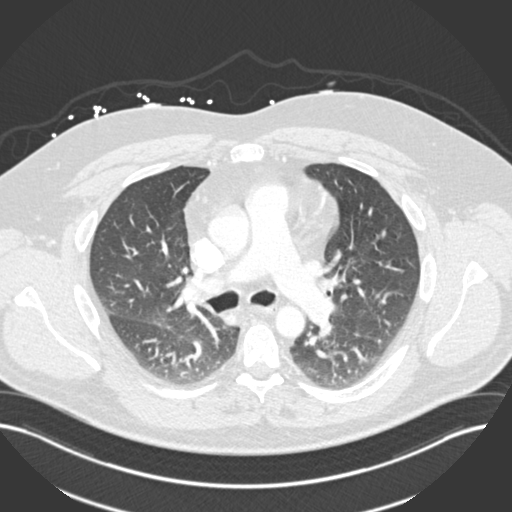
[im 206/275  lung]
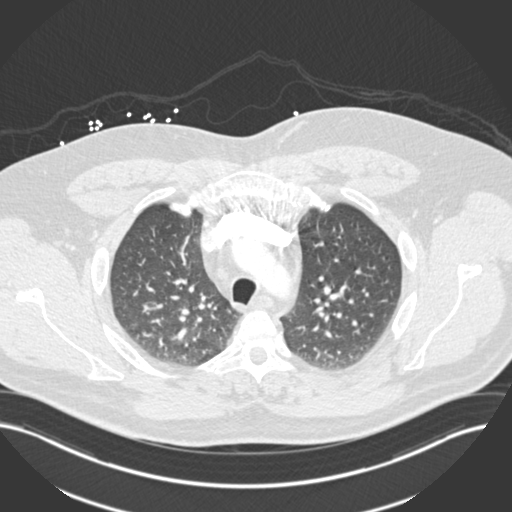
[im 240/275  lung]
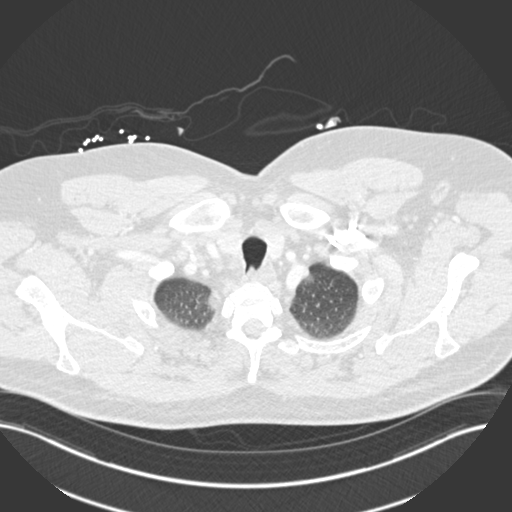

[Series 19: pe thins · axial · 0.86mm/px · z∈[-198,+9]mm · 8 of 267 slices shown (2 of 2)]
[im 30/267  lung]
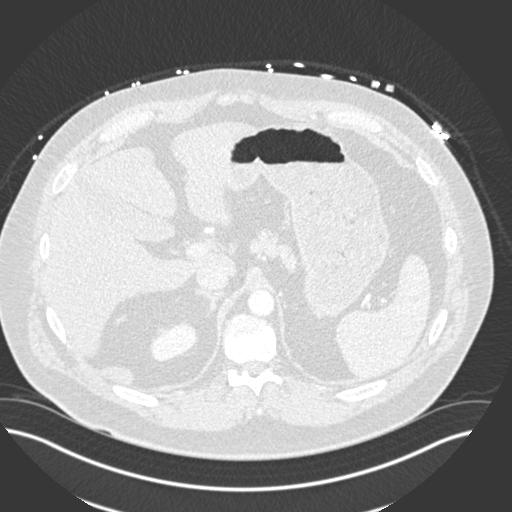
[im 60/267  mediastinal]
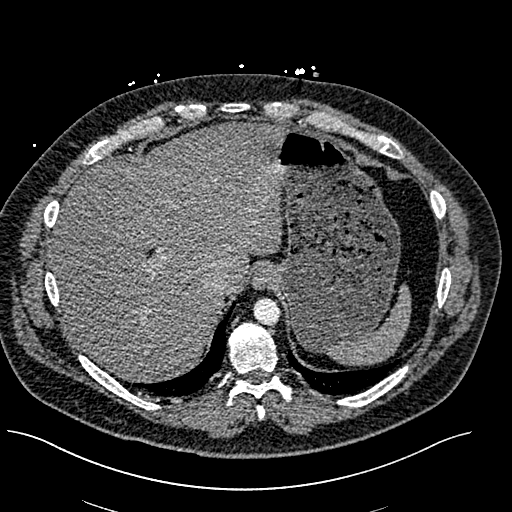
[im 89/267  lung]
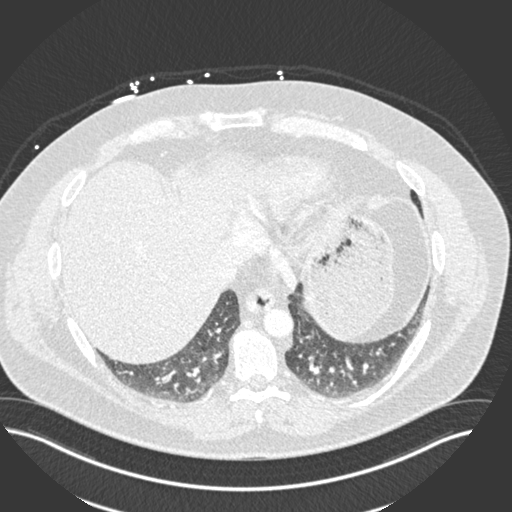
[im 119/267  mediastinal]
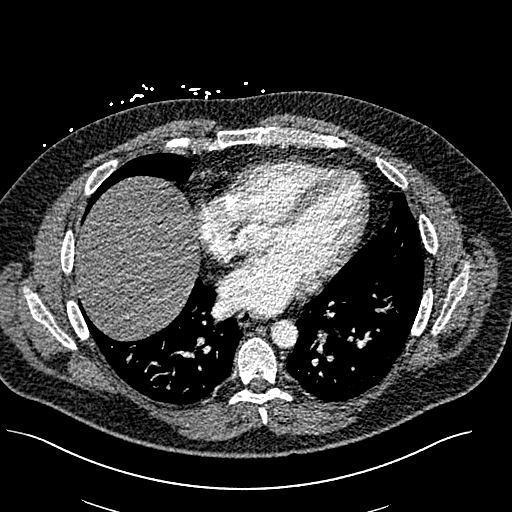
[im 148/267  lung]
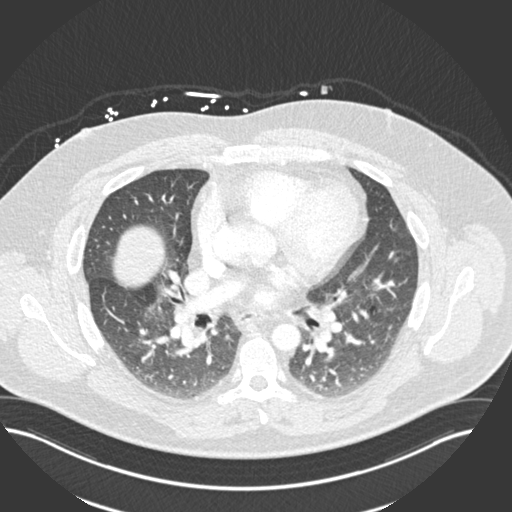
[im 178/267  mediastinal]
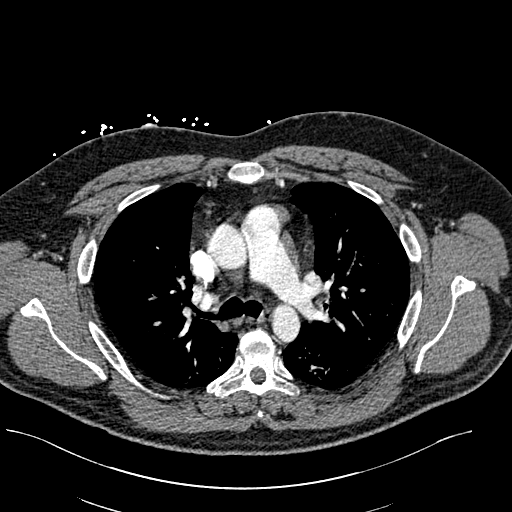
[im 207/267  lung]
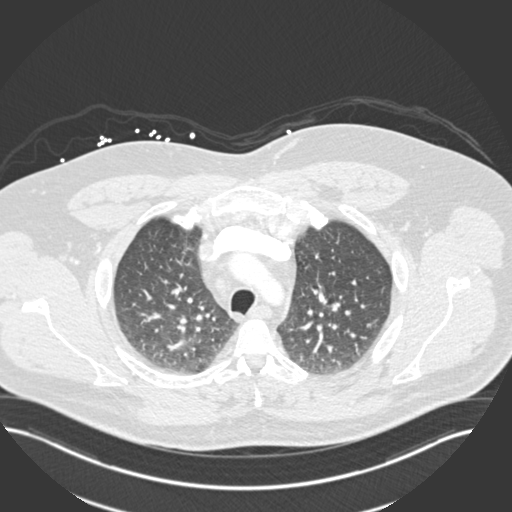
[im 237/267  mediastinal]
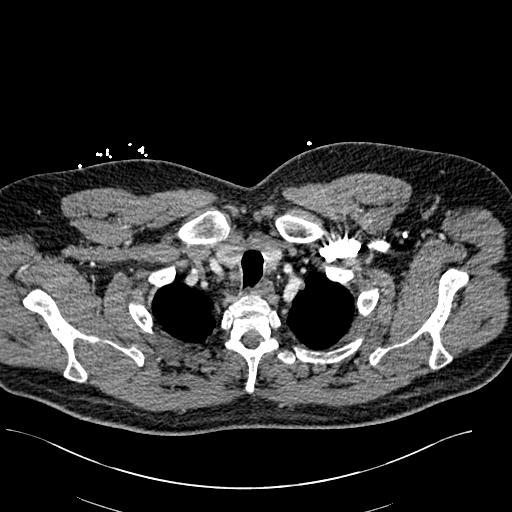

[15 of 38 positions shown; findings below may reference images not displayed]

RADIATION DOSE REDUCTION: This exam was performed according to the
departmental dose-optimization program which includes automated
exposure control, adjustment of the mA and/or kV according to
patient size and/or use of iterative reconstruction technique.

CONTRAST:  100mL OMNIPAQUE IOHEXOL 350 MG/ML SOLN
FINDINGS: Cardiovascular: Second scan satisfactory opacification of the
pulmonary arteries to the segmental level. No evidence of pulmonary
embolism. Normal heart size. No significant pericardial effusion.
The thoracic aorta is normal in caliber. No atherosclerotic plaque
of the thoracic aorta. No coronary artery calcifications.

Mediastinum/Nodes: 1.5 cm right paratracheal lymph node ([DATE]). No
enlarged hilar or axillary lymph nodes. Thyroid gland, trachea, and
esophagus demonstrate no significant findings.

Lungs/Pleura: No focal consolidation. No acute abnormality. No
pulmonary mass. No pleural effusion. No pneumothorax.

Upper Abdomen: No acute abnormality.

Musculoskeletal:

No chest wall abnormality.

No suspicious lytic or blastic osseous lesions. No acute displaced
fracture. Multilevel degenerative changes of the spine.

Review of the MIP images confirms the above findings.
IMPRESSION: 1. No pulmonary embolus.
2. Nonspecific 1.5 cm right paratracheal lymph node.
3. No acute intrapulmonary abnormality.

ADDENDUM:
Case was discussed 05/04/2021 at [DATE] with Dr. Salama. I
recommended a follow-up chest CT in 3 months to further evaluate
cm right paratracheal lymph node.

*** End of Addendum ***
RADIATION DOSE REDUCTION: This exam was performed according to the
departmental dose-optimization program which includes automated
exposure control, adjustment of the mA and/or kV according to
patient size and/or use of iterative reconstruction technique.

CONTRAST:  100mL OMNIPAQUE IOHEXOL 350 MG/ML SOLN
FINDINGS: Cardiovascular: Second scan satisfactory opacification of the
pulmonary arteries to the segmental level. No evidence of pulmonary
embolism. Normal heart size. No significant pericardial effusion.
The thoracic aorta is normal in caliber. No atherosclerotic plaque
of the thoracic aorta. No coronary artery calcifications.

Mediastinum/Nodes: 1.5 cm right paratracheal lymph node ([DATE]). No
enlarged hilar or axillary lymph nodes. Thyroid gland, trachea, and
esophagus demonstrate no significant findings.

Lungs/Pleura: No focal consolidation. No acute abnormality. No
pulmonary mass. No pleural effusion. No pneumothorax.

Upper Abdomen: No acute abnormality.

Musculoskeletal:

No chest wall abnormality.

No suspicious lytic or blastic osseous lesions. No acute displaced
fracture. Multilevel degenerative changes of the spine.

Review of the MIP images confirms the above findings.
IMPRESSION: 1. No pulmonary embolus.
2. Nonspecific 1.5 cm right paratracheal lymph node.
3. No acute intrapulmonary abnormality.

## 2023-01-13 IMAGING — CT CT HEAD W/O CM
3 series · 14 of 47 positions shown, 16 images · non-contrast
Comparison: None.

CLINICAL DATA: Dizziness, persistent/recurrent, cardiac or vascular
cause suspected Strange sensation in head followed by syncopal
episode. Also recent left leg pain, sensitive dread, and syncopal
episode with diaphoresis.



[Series 3: head wo · axial · 0.47mm/px · z∈[+160,+290]mm · 8 of 32 slices shown, 10 images]
[im 3/32  brain]
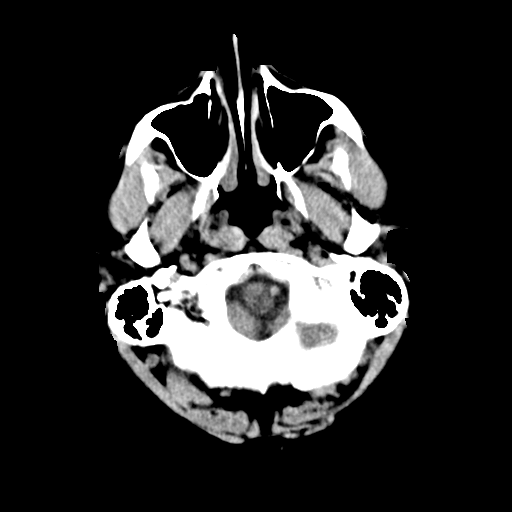
[im 3/32  bone]
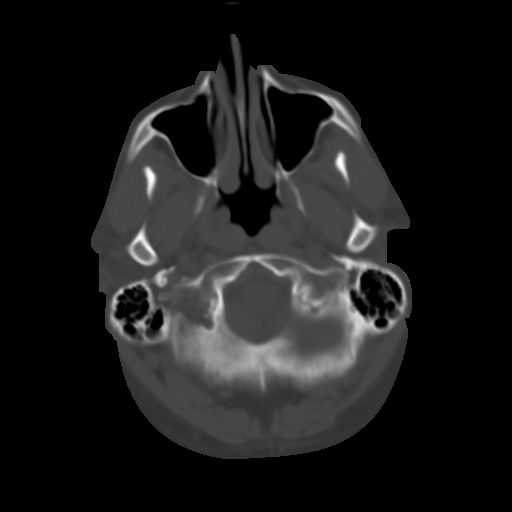
[im 7/32  brain]
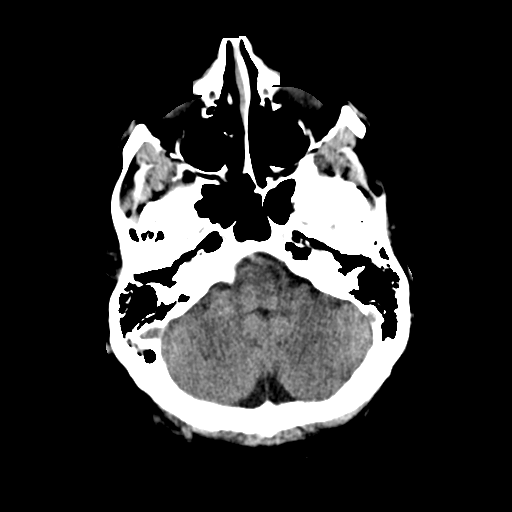
[im 10/32  brain]
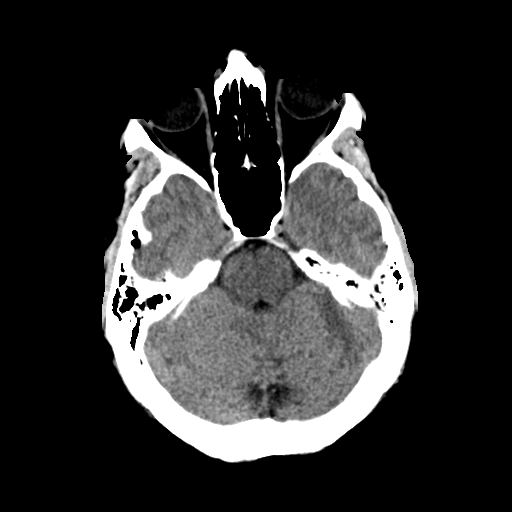
[im 14/32  brain]
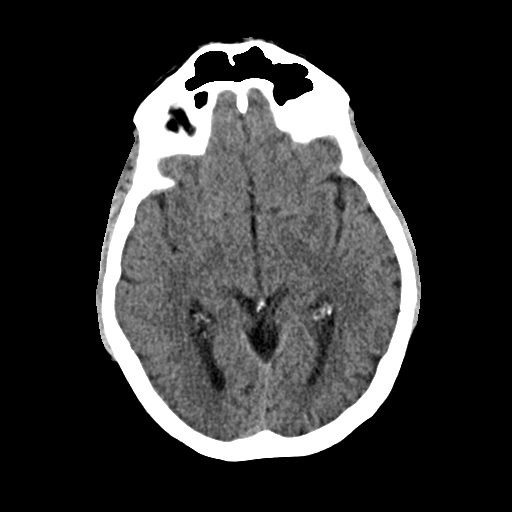
[im 18/32  brain]
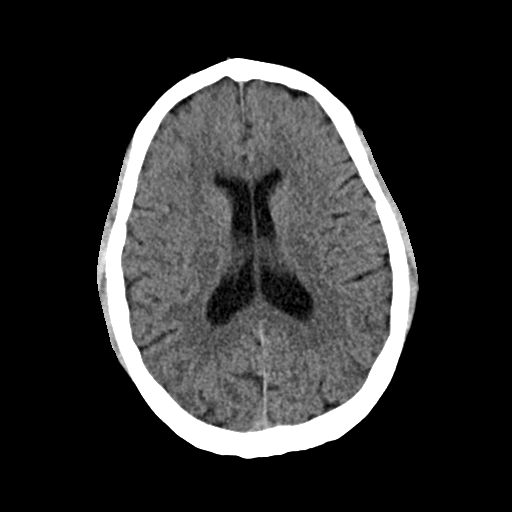
[im 18/32  bone]
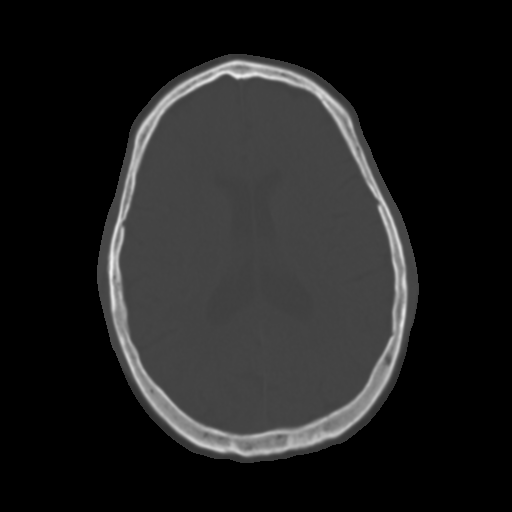
[im 22/32  brain]
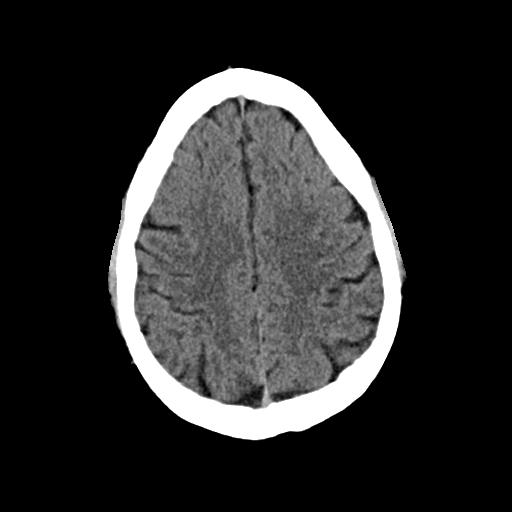
[im 25/32  brain]
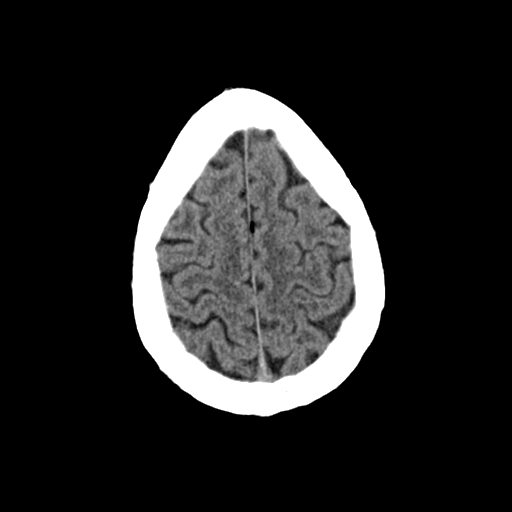
[im 29/32  brain]
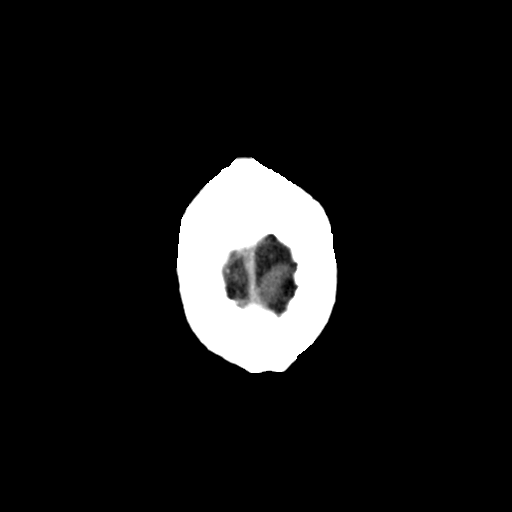

[Series 5: coronal soft · coronal · 0.29mm/px · 3 of 69 slices shown]
[im 23/69  brain]
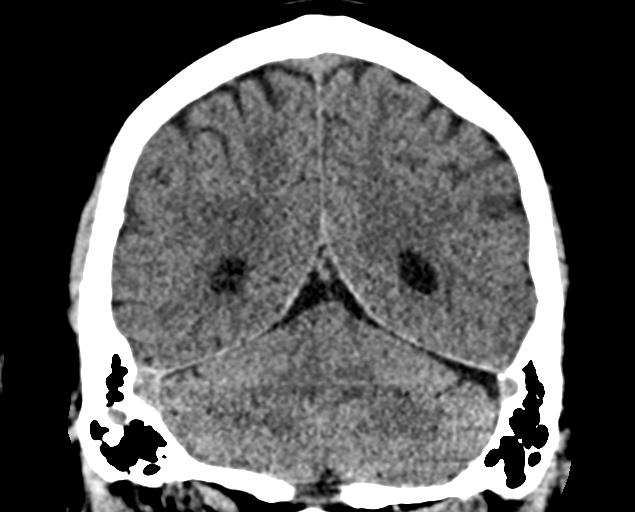
[im 31/69  brain]
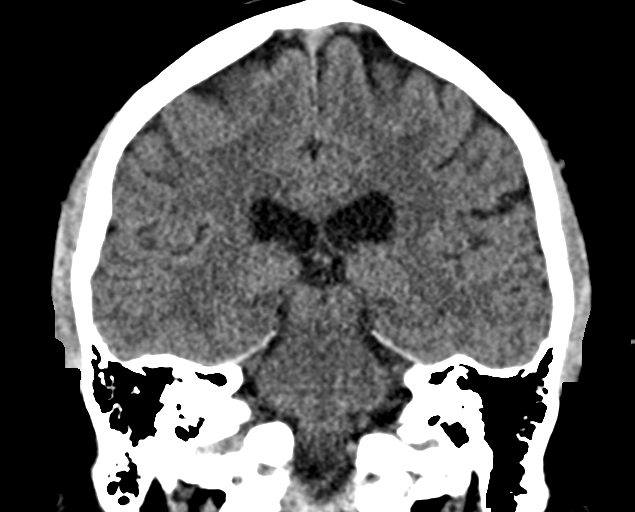
[im 38/69  brain]
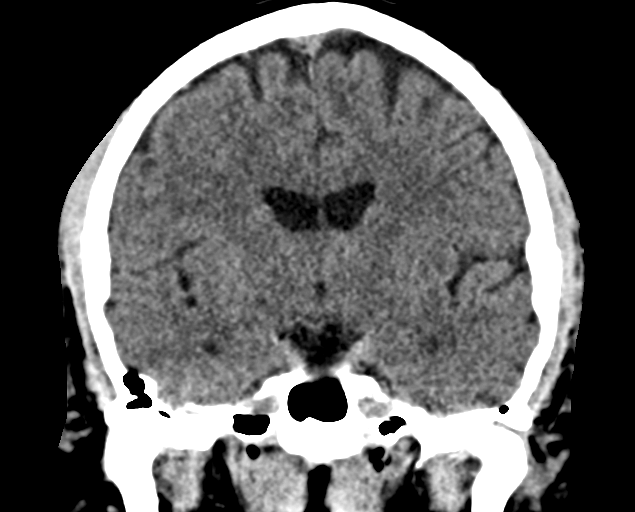

[Series 6: sag soft · sagittal · 0.29mm/px · 3 of 59 slices shown]
[im 20/59  brain]
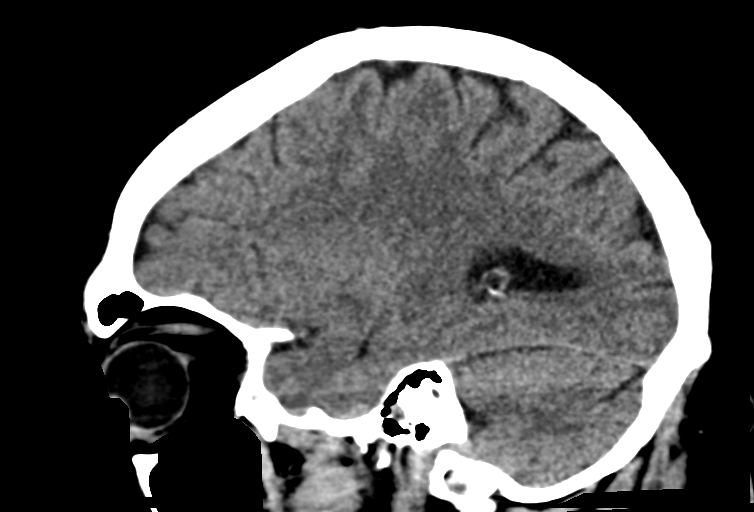
[im 30/59  brain]
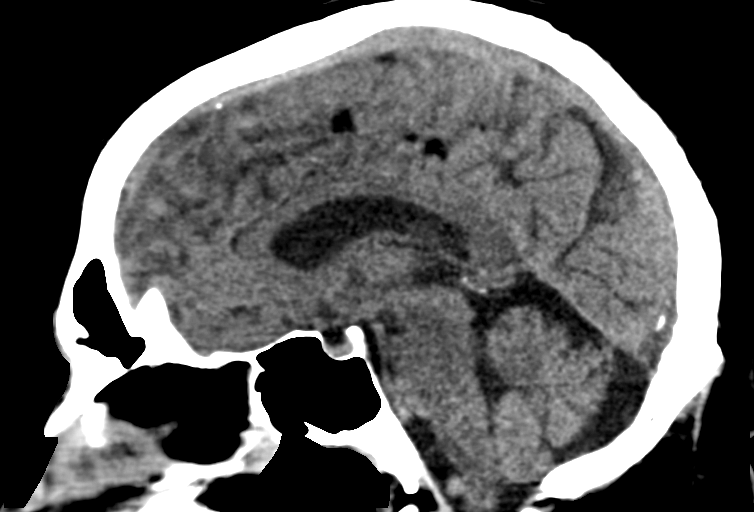
[im 39/59  brain]
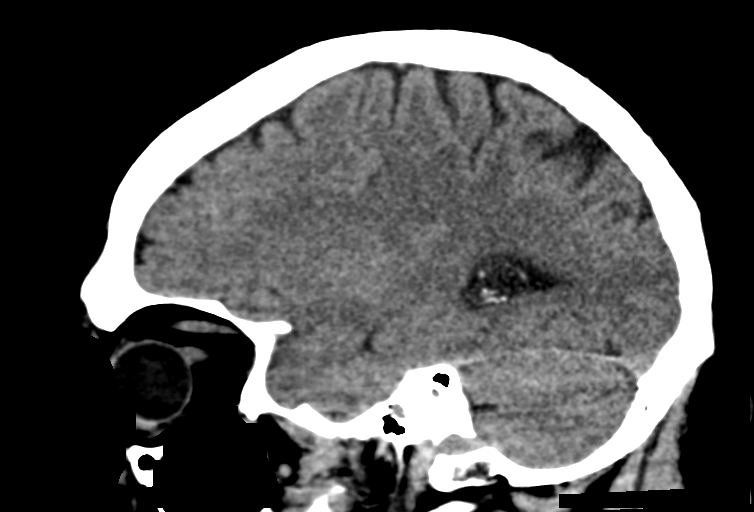

[14 of 47 positions shown; findings below may reference images not displayed]

BRAIN:
BRAIN
Trace patchy and confluent areas of decreased attenuation are noted
throughout the deep and periventricular white matter of the cerebral
hemispheres bilaterally, compatible with chronic microvascular
ischemic disease.

No evidence of large-territorial acute infarction. No parenchymal
hemorrhage. No mass lesion. No extra-axial collection.

No mass effect or midline shift. No hydrocephalus. Basilar cisterns
are patent.

Vascular: No hyperdense vessel.

Skull: No acute fracture or focal lesion.

Sinuses/Orbits: Bilateral maxillary mucosal thickening. Otherwise
paranasal sinuses and mastoid air cells are clear. The orbits are
unremarkable.

Other: None.
IMPRESSION: No acute intracranial abnormality with trace chronic microvascular
ischemic changes.

## 2023-03-19 NOTE — Progress Notes (Unsigned)
Cardiology Office Note:  .   Date:  03/19/2023  ID:  Ryan Williamson, DOB 1975/08/22, MRN [id number] PCP: Farris Has, MD  [facility] Providers Cardiologist:  Rollene Rotunda, MD { Click to update primary MD,subspecialty MD or APP then REFRESH:1}   Patient Profile: .      PMH Palpitations SVT diagnosed at [facility] clinic in 2013 Syncope Calcium score of 0 on CCTA 06/2020 Hypertension Hyperlipidemia Obesity  ED visit 04/30/2021 for syncope.  He reported passing out after standing up and getting lightheaded and diaphoretic.  He had been feeling off the previous few days with close family friend who had recently passed away from a stroke.  This made him very anxious.  At the time he was taking Golo dietary supplements to help with weight loss.  He had CT that showed no evidence of PE, no evidence of DVT.  He improved with fluids and potassium.  Seen by Dr. Antoine Poche 08/14/2021 and reported no further episodes of presyncope or syncope.  He reported short paroxysms of symptomatic palpitations similar to previous diagnosis of SVT.  Discussion of whether beta-blockers were adding to fatigue and discussion of switching to calcium channel blocker.  He wanted to work on diet and weight loss before making any medication changes.  He was [facility] to [facility] of obesity.  He was advised he could stop aspirin for primary prevention of CAD.  Last cardiology clinic visit was 04/19/2022 with me.  He reported irregular heart rate and missed beats noted on 04/14/2022.  In the past when this occurs it usually resolves quickly however this occasion symptoms lasted 36 hours.  Kardia monitor revealed SVT.  He tried Valsalva maneuvers and continued metoprolol without improvement.  He denies exacerbating factors including increased caffeine, alcohol, or other illness.  Admits the stress of the holidays may have contributed.  He travels frequently for his job.  Home BP usually 130s  systolic.  His wife has reported that he rarely snores.  He continued to struggle with the desire to lose weight and exercise on a consistent basis. No chest pain, orthopnea, PND, orthopnea, presyncope, syncope.  Review of cardia monitor revealed frequent PVCs.  He was advised to undergo echocardiogram to rule out structural heart disease, however there is no record that this was completed.  BMP did not reveal electrolyte imbalance.  He was encouraged to continue Lopressor and to take diltiazem 30 mg up to 4 times daily as needed for palpitations. Lipid panel completed at that visit revealed total cholesterol 223, triglycerides 250, HDL 60, LDL 120. Primary prevention encouraged.       History of Present Illness: .   Ryan Williamson is a *** 47 y.o. male ***   Discussed the use of AI scribe software for clinical note transcription with the patient, who gave verbal consent to proceed.   ROS: ***       Studies Reviewed: .        *** Risk Assessment/Calculations:   {Does this patient have ATRIAL FIBRILLATION?:[phone]} No BP recorded.  {Refresh Note OR Click here to enter BP  :1}***       Physical Exam:   VS:  There were no vitals taken for this visit.   Wt Readings from Last 3 Encounters:  04/19/22 265 lb 12.8 oz (120.6 kg)  08/14/21 266 lb (120.7 kg)  04/30/21 255 lb (115.7 kg)    GEN: Well nourished, well developed in no acute distress NECK: No JVD;  No carotid bruits CARDIAC: ***RRR, no murmurs, rubs, gallops RESPIRATORY:  Clear to auscultation without rales, wheezing or rhonchi  ABDOMEN: Soft, non-tender, non-distended EXTREMITIES:  No edema; No deformity     ASSESSMENT AND PLAN: .    Palpitations/SVT/Frequent PVCs: Hypertension: Obesity Hyperlipidemia  CV Risk:    {Are you ordering a CV Procedure (e.g. stress test, cath, DCCV, TEE, etc)?   Press F2        :[id number]}  Dispo: ***  Signed, Eligha Bridegroom, NP-C

## 2023-03-25 ENCOUNTER — Ambulatory Visit: Payer: BLUE CROSS/BLUE SHIELD | Admitting: Nurse Practitioner

## 2023-06-03 NOTE — Progress Notes (Unsigned)
Cardiology Office Note:  .   Date:  06/06/2023  ID:  Ryan Williamson, DOB 1975-12-23, MRN [id number] PCP: Ryan Has, MD  [facility] Providers Cardiologist:  Ryan Rotunda, MD    Patient Profile: .      PMH Palpitations SVT diagnosed at [facility] clinic in 2013 Syncope Calcium score of 0 on CCTA 06/2020 Hypertension Hyperlipidemia Obesity  ED visit 04/30/2021 for syncope.  He reported passing out after standing up and getting lightheaded and diaphoretic.  He had been feeling off the previous few days with close family friend who had recently passed away from a stroke.  This made him very anxious.  At the time he was taking Golo dietary supplements to help with weight loss.  He had CT that showed no evidence of PE, no evidence of DVT.  He improved with fluids and potassium.   Seen by Dr. Antoine Williamson 08/14/2021 and reported no further episodes of presyncope or syncope.  He reported short paroxysms of symptomatic palpitations similar to previous diagnosis of SVT.  D   iscussion of whether beta-blockers were adding to fatigue and discussion of switching to calcium channel blocker.  He wanted to work on diet and weight loss before making any medication changes.  He was referred to [facility] for management of obesity.  He was advised he could stop aspirin for primary prevention of CAD.   Last cardiology clinic visit was 04/19/2022 with me.  He reported irregular heart rate and missed beats noted on 04/14/2022.  In the past when this occurs it usually resolves quickly however this occasion symptoms lasted 36 hours.  Kardia monitor revealed SVT.  He tried Valsalva maneuvers and continued metoprolol without improvement.  He denies exacerbating factors including increased caffeine, alcohol, or other illness.  Admits the stress of the holidays may have contributed.  He travels frequently for his job.  Home BP usually 130s systolic.  His wife Williamson reported that he rarely snores.  He  continued to struggle with the desire to lose weight and exercise on a consistent basis. No chest pain, orthopnea, PND, orthopnea, presyncope, syncope.  Review of cardia monitor revealed frequent PVCs.  He was advised to undergo echocardiogram to rule out structural heart disease, however there is no record that this was completed.  BMP did not reveal electrolyte imbalance.  He was encouraged to continue Lopressor and to take diltiazem 30 mg up to 4 times daily as needed for palpitations. Lipid panel completed at that visit revealed total cholesterol 223, triglycerides 250, HDL 60, LDL 120. Primary prevention encouraged.        History of Present Illness: .   Ryan Williamson is a very pleasant 48 y.o. male who is here today for follow-up of SVT. He reports concerns about elevated BP readings at home, particularly the diastolic number. He Williamson occasional palpitations but no episodes of prolonged irregular HR or tachycardia suggesting that his SVT is well-controlled. He occasionally takes an extra half dose of metoprolol if he feels his heart rate increase, which he attributes to higher dietary sodium intake. He also mentions having had good results with propranolol in the past. He continues to travel extensively for work, which he finds stressful and challenging in terms of maintaining a healthy diet. He Williamson tried various strategies including fasting and using diet-tracking apps, but finds it difficult to maintain a consistent routine due to his travel schedule. He expresses frustration with the impact of his medication on his metabolism, feeling  as though it is working against him. He wonders if he should consider undergoing an ablation for his SVT, which was suggested to him during a previous consultation at the [facility].  He denies chest pain or dyspnea on exertion, orthopnea, PND, edema, presyncope, or syncope.  He wears compression socks and takes aspirin prior to long flights.  Discussed the use of  AI scribe software for clinical note transcription with the patient, who gave verbal consent to proceed.   ROS: See HPI       Studies Reviewed: Marland Kitchen   EKG Interpretation Date/Time:  Thursday June 06 2023 11:43:26 EST Ventricular Rate:  77 PR Interval:  136 QRS Duration:  88 QT Interval:  388 QTC Calculation: 439 R Axis:   49  Text Interpretation: Normal sinus rhythm Inferior infarct (cited on or before 30-Apr-2021) Cannot rule out Anterior infarct , age undetermined When compared with ECG of 30-Apr-2021 15:29, Minimal criteria for Anterior infarct are now Present T wave inversion no longer evident in Lateral leads No acute changes Confirmed by Ryan Williamson [phone]) on 06/06/2023 11:45:31 AM    Risk Assessment/Calculations:     HYPERTENSION CONTROL Vitals:   06/06/23 1114 06/06/23 1302  BP: (!) 140/98 (!) 144/90    The patient's blood pressure is elevated above target today.  In order to address the patient's elevated BP: A new medication was prescribed today.          Physical Exam:   VS:  BP (!) 144/90   Pulse 75   Ht 6' (1.829 m)   Wt 280 lb (127 kg)   SpO2 96%   BMI 37.97 kg/m    Wt Readings from Last 3 Encounters:  06/06/23 280 lb (127 kg)  04/19/22 265 lb 12.8 oz (120.6 kg)  08/14/21 266 lb (120.7 kg)    GEN: Obese, well developed in no acute distress NECK: No JVD; No carotid bruits CARDIAC: RRR, no murmurs, rubs, gallops RESPIRATORY:  Clear to auscultation without rales, wheezing or rhonchi  ABDOMEN: Soft, non-tender, non-distended EXTREMITIES:  No edema; No deformity     ASSESSMENT AND PLAN: .    Palpitations/SVT/Frequent PVCs: Occasional palpitations. Occasional increase in HR for which he  takes an additional half tablet of metoprolol. Symptoms do not inhibit regular activities. He wonders if he would be a candidate for SVT ablation which would eliminate the need for metoprolol. Advised he could be referred to EP physician for consideration. He will  think about it and let us know.  Was initially diagnosed with SVT at [facility] and Williamson considered returning there for treatment.  He rarely takes diltiazem. Feels propranolol worked well for him in the past.  We will provide a new prescription for propranolol 10 mg up to 4 times daily prn.   Hypertension: BP is elevated initially and remains elevated on my recheck.  He admits to recent elevated readings, particularly diastolic.  We will add hydrochlorothiazide 25 mg daily and potassium chloride 10 mEq daily.  He is having lab work with PCP next week.  Encouraged him to discuss current antihypertensive therapy and BP improvement at PCP appointment or notify me with concerns.   Obesity: Lengthy discussion about unhealthy weight. He feels metoprolol is contributing but appreciates how well controlled his SVT is on the medication. He reports difficulty due to frequent travel but admits it is a simple equation to weight loss.  Encouraged him to avoid processed foods, simple carbohydrates, saturated fat, and sugar and aim for 150  minutes of moderate intensity exercise each week.   Hyperlipidemia LDL goal < 100: Coronary calcium score of 0 in 2022 and no coronary artery calcification on CT 2023. Last lipid panel 04/19/2022 with total cholesterol 223, HDL 60, LDL 120, and triglycerides 250. He Williamson upcoming lab work with PCP. Encouraged him to consider statin therapy in the setting of additional risk factors for ASCVD including hypertension and obesity. Encouraged primary prevention including heart healthy mostly plant based diet avoiding saturated fat, processed foods, simple carbohydrates, and sugar along with aiming for at least 150 minutes of moderate intensity exercise each week.         Disposition:12 months with Dr. Antoine Williamson or me  Signed, Ryan Bridegroom, NP-C

## 2023-06-06 ENCOUNTER — Ambulatory Visit: Payer: BLUE CROSS/BLUE SHIELD | Attending: Nurse Practitioner | Admitting: Nurse Practitioner

## 2023-06-06 ENCOUNTER — Encounter: Payer: Self-pay | Admitting: Nurse Practitioner

## 2023-06-06 VITALS — BP 144/90 | HR 75 | Ht 72.0 in | Wt 280.0 lb

## 2023-06-06 DIAGNOSIS — E66812 Obesity, class 2: Secondary | ICD-10-CM

## 2023-06-06 DIAGNOSIS — I471 Supraventricular tachycardia, unspecified: Secondary | ICD-10-CM

## 2023-06-06 DIAGNOSIS — R002 Palpitations: Secondary | ICD-10-CM

## 2023-06-06 DIAGNOSIS — Z6837 Body mass index (BMI) 37.0-37.9, adult: Secondary | ICD-10-CM

## 2023-06-06 DIAGNOSIS — E785 Hyperlipidemia, unspecified: Secondary | ICD-10-CM

## 2023-06-06 DIAGNOSIS — I10 Essential (primary) hypertension: Secondary | ICD-10-CM

## 2023-06-06 MED ORDER — POTASSIUM CHLORIDE CRYS ER 10 MEQ PO TBCR: 10.0000 meq | EXTENDED_RELEASE_TABLET | Freq: Every day | ORAL | 3 refills | Status: DC

## 2023-06-06 MED ORDER — HYDROCHLOROTHIAZIDE 25 MG PO TABS: 25.0000 mg | ORAL_TABLET | Freq: Every day | ORAL | 3 refills | Status: AC

## 2023-06-06 MED ORDER — PROPRANOLOL HCL 10 MG PO TABS
10.0000 mg | ORAL_TABLET | Freq: Four times a day (QID) | ORAL | 6 refills | Status: DC
Start: 2023-06-06 — End: 2023-10-11

## 2023-06-06 NOTE — Patient Instructions (Addendum)
Medication Instructions:   DISCONTINUE Diltiazem.  START hydrochlorothiazide one (1) tablet by mouth ( 25 mg) daily,.   START K-dur one (1) tablet by mouth ( 10 mEq) daily.  START Propranolol one (1) tablet by mouth ( 10 mg) as needed up to 4 times daily for palpitations.    *If you need a refill on your cardiac medications before your next appointment, please call your pharmacy*   Lab Work:  Patient is getting labs with PCP.  If you have labs (blood work) drawn today and your tests are completely normal, you will receive your results only by: MyChart Message (if you have MyChart) OR A paper copy in the mail If you have any lab test that is abnormal or we need to change your treatment, we will call you to review the results.   Testing/Procedures:  None ordered.   Follow-Up: At [facility], you and your health needs are our priority.  As part of our continuing mission to provide you with exceptional heart care, we have created designated Provider Care Teams.  These Care Teams include your primary Cardiologist (physician) and Advanced Practice Providers (APPs -  Physician Assistants and Nurse Practitioners) who all work together to provide you with the care you need, when you need it.  We recommend signing up for the patient portal called "MyChart".  Sign up information is provided on this After Visit Summary.  MyChart is used to connect with patients for Virtual Visits (Telemedicine).  Patients are able to view lab/test results, encounter notes, upcoming appointments, etc.  Non-urgent messages can be sent to your provider as well.   To learn more about what you can do with MyChart, go to [email or web address].    Your next appointment:   1 year(s)  Provider:   Eligha Bridegroom, NP    Other Instructions  Your physician wants you to follow-up in: 1 year.  You will receive a reminder letter in the mail two months in advance. If you don't receive a letter, please call  our office to schedule the follow-up appointment.

## 2023-06-07 ENCOUNTER — Other Ambulatory Visit: Payer: Self-pay

## 2023-06-07 MED ORDER — METOPROLOL TARTRATE 100 MG PO TABS: 100.0000 mg | ORAL_TABLET | Freq: Two times a day (BID) | ORAL | 3 refills | Status: DC

## 2023-06-19 ENCOUNTER — Other Ambulatory Visit: Payer: Self-pay

## 2023-06-19 MED ORDER — LISINOPRIL 20 MG PO TABS: 20.0000 mg | ORAL_TABLET | Freq: Every day | ORAL | 3 refills | Status: AC

## 2023-10-02 ENCOUNTER — Ambulatory Visit

## 2023-10-11 ENCOUNTER — Other Ambulatory Visit: Payer: Self-pay | Admitting: Nurse Practitioner

## 2023-11-13 ENCOUNTER — Ambulatory Visit: Admitting: Family

## 2023-11-19 ENCOUNTER — Ambulatory Visit

## 2023-12-13 ENCOUNTER — Ambulatory Visit: Admitting: Family

## 2023-12-23 ENCOUNTER — Ambulatory Visit: Payer: Self-pay

## 2023-12-23 NOTE — Telephone Encounter (Signed)
       FYI Only or Action Required?: FYI only for provider.  Patient was last seen in primary care on n/a.  Called Nurse Triage reporting Dizziness.  Symptoms began 1-2 weeks ago.  Interventions attempted: Rest, hydration, or home remedies.  Symptoms are: unchanged.  Triage Disposition: See PCP When Office is Open (Within 3 Days)  Patient/caregiver understands and will follow disposition?:  Yes Advised pt to call PCP at [facility]. Advised pt there are 2 new pt appts- pt stated he will talk to his wife and clean it up.        Copied from CRM [id number]. Topic: Clinical - Red Word Triage >> Dec 23, 2023 11:02 AM Antwanette L wrote: Red Word that prompted transfer to Nurse Triage: Pt is experiencing light headiness and fatigue Reason for Disposition  [1] MILD dizziness (e.g., walking normally) AND [2] has NOT been evaluated by doctor (or NP/PA) for this  (Exception: Dizziness caused by heat exposure, sudden standing, or poor fluid intake.)  Answer Assessment - Initial Assessment Questions 1. DESCRIPTION: Describe your dizziness.     lethargic 2. LIGHTHEADED: Do you feel lightheaded? (e.g., somewhat faint, woozy, weak upon standing)     yes 3. VERTIGO: Do you feel like either you or the room is spinning or tilting? (i.e., vertigo)     no 4. SEVERITY: How bad is it?  Do you feel like you are going to faint? Can you stand and walk?     intermittent 5. ONSET:  When did the dizziness begin?     1-2 weeks  6. AGGRAVATING FACTORS: Does anything make it worse? (e.g., standing, change in head position)     standing 8. CAUSE: What do you think is causing the dizziness? (e.g., decreased fluids or food, diarrhea, emotional distress, heat exposure, new medicine, sudden standing, vomiting; unknown)     Allergies added Flonase wondered if was causing sx  10. OTHER SYMPTOMS: Do you have any other symptoms? (e.g., fever, chest pain, vomiting, diarrhea, bleeding)        Fatigue, dry mouth at night, lightheadedness  Protocols used: Dizziness - Lightheadedness-A-AH

## 2024-01-08 ENCOUNTER — Ambulatory Visit

## 2024-01-30 NOTE — Progress Notes (Signed)
  Cardiology Office Note:   Date:  01/31/2024  ID:  Wray Goehring, DOB [birth year], MRN [id number] PCP: Kip Righter, MD  [facility] Providers Cardiologist:  Lynwood Schilling, MD {  History of Present Illness:   Ryan Williamson is a 48 y.o. male who presents for follow up of palpitations and syncope.  He had chest pain and had a zero calcium score.   He was in the ED in Jan 2023.     He was thought to be dehydrated. CT demonstrated no pulmonary embolism.    Since I last saw him he has had no new cardiovascular problems.  He does have palpitations particularly when he gets dehydrated.  However, he is able to handle this with med management.  His blood pressure has been well-controlled.  He has not lost weight.  His lipids are still abnormal as below.  He denies any chest pressure, neck or arm discomfort.  He is not having any new shortness of breath, PND or orthopnea.  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   He does have daytime somnolence.  There is some snoring.  STOP-BANG is 5.  ROS: As stated in the HPI and negative for all other systems.  Studies Reviewed:    EKG:     NA    Risk Assessment/Calculations:       Physical Exam:   VS:  BP 122/77 (BP Location: Left Arm, Patient Position: Sitting, Cuff Size: Large)   Pulse 78   Ht 6' (1.829 m)   Wt 277 lb 9.6 oz (125.9 kg)   SpO2 98%   BMI 37.65 kg/m    Wt Readings from Last 3 Encounters:  01/31/24 277 lb 9.6 oz (125.9 kg)  06/06/23 280 lb (127 kg)  04/19/22 265 lb 12.8 oz (120.6 kg)     GEN: Well nourished, well developed in no acute distress NECK: No JVD; No carotid bruits CARDIAC: RRR, no murmurs, rubs, gallops RESPIRATORY:  Clear to auscultation without rales, wheezing or rhonchi  ABDOMEN: Soft, non-tender, non-distended EXTREMITIES:  No edema; No deformity   ASSESSMENT AND PLAN:   SVT:    These are managed medically.  He is able to get rid of these with as needed  beta-blockers.  No change in therapy.  No consideration of intervention at this time.  HTN:  His blood pressure is well-controlled on the current meds.  No change in therapy.  RISK REDUCTION: His LDL is 116.  HDL 48.  Total 210 and triglycerides 263.  We had a long discussion about this.  I do not think he needs a statin.  We had a long discussion about Mediterranean plant forward diet.   OBESITY: I will eventually look into getting him on a GLP-1 receptor agonist.  He did not have a good experience with [facility].    DAYTIME SOMNOLENCE: He has daytime somnolence, snoring, STOP-BANG is 5.  In order a home sleep study.     Follow up with me in one year.   Signed, Lynwood Schilling, MD

## 2024-01-31 ENCOUNTER — Encounter: Payer: Self-pay | Admitting: Cardiology

## 2024-01-31 ENCOUNTER — Ambulatory Visit: Attending: Cardiology | Admitting: Cardiology

## 2024-01-31 VITALS — BP 122/77 | HR 78 | Ht 72.0 in | Wt 277.6 lb

## 2024-01-31 DIAGNOSIS — R55 Syncope and collapse: Secondary | ICD-10-CM | POA: Diagnosis not present

## 2024-01-31 DIAGNOSIS — I471 Supraventricular tachycardia, unspecified: Secondary | ICD-10-CM

## 2024-01-31 DIAGNOSIS — I10 Essential (primary) hypertension: Secondary | ICD-10-CM

## 2024-01-31 DIAGNOSIS — G4733 Obstructive sleep apnea (adult) (pediatric): Secondary | ICD-10-CM

## 2024-01-31 NOTE — Patient Instructions (Addendum)
 Medication Instructions:  Your physician recommends that you continue on your current medications as directed. Please refer to the Current Medication list given to you today.  *If you need a refill on your cardiac medications before your next appointment, please call your pharmacy*  Lab Work: NONE If you have labs (blood work) drawn today and your tests are completely normal, you will receive your results only by: MyChart Message (if you have MyChart) OR A paper copy in the mail If you have any lab test that is abnormal or we need to change your treatment, we will call you to review the results.  Testing/Procedures: [facility] Sleep Study  Follow-Up: At [facility], you and your health needs are our priority.  As part of our continuing mission to provide you with exceptional heart care, our providers are all part of one team.  This team includes your primary Cardiologist (physician) and Advanced Practice Providers or APPs (Physician Assistants and Nurse Practitioners) who all work together to provide you with the care you need, when you need it.  Your next appointment:   1 year  Provider:   [place], MD  We recommend signing up for the patient portal called MyChart.  Sign up information is provided on this After Visit Summary.  MyChart is used to connect with patients for Virtual Visits (Telemedicine).  Patients are able to view lab/test results, encounter notes, upcoming appointments, etc.  Non-urgent messages can be sent to your provider as well.   To learn more about what you can do with MyChart, go to [email or web address].

## 2024-02-15 ENCOUNTER — Ambulatory Visit

## 2024-02-18 ENCOUNTER — Ambulatory Visit: Admitting: Family

## 2024-02-19 ENCOUNTER — Other Ambulatory Visit: Payer: Self-pay

## 2024-02-19 ENCOUNTER — Ambulatory Visit: Admission: RE | Admit: 2024-02-19 | Discharge: 2024-02-19 | Disposition: A | Discharge status: Home / Self Care

## 2024-02-19 VITALS — BP 123/83 | HR 83 | Temp 98.8°F | Resp 18 | Ht 72.0 in | Wt 260.0 lb

## 2024-02-19 DIAGNOSIS — T148XXA Other injury of unspecified body region, initial encounter: Secondary | ICD-10-CM

## 2024-02-19 DIAGNOSIS — L03116 Cellulitis of left lower limb: Secondary | ICD-10-CM | POA: Diagnosis not present

## 2024-02-19 MED ORDER — DOXYCYCLINE MONOHYDRATE 100 MG PO TABS: 100.0000 mg | ORAL_TABLET | Freq: Two times a day (BID) | ORAL | 0 refills | Status: AC

## 2024-02-19 NOTE — Discharge Instructions (Signed)
  1. Animal scratch (Primary) 2. Cellulitis of left lower leg - doxycycline (ADOXA) 100 MG tablet; Take 1 tablet (100 mg total) by mouth 2 (two) times daily for 7 days.  Dispense: 14 tablet; Refill: 0 - Continue to regularly clean area with soap and warm water, do not scrub the area. - Apply antibiotic ointment 2-3 times daily to prevent any secondary infection and to aid in healing. - Discontinue use of hydrogen peroxide as hydroperoxide may affect growth of new skin and prevent healing. -Continue to monitor symptoms for any change in severity if there is any escalation of current symptoms or development of new symptoms follow-up in ER for further evaluation and management.
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
## 2024-02-25 ENCOUNTER — Encounter (HOSPITAL_BASED_OUTPATIENT_CLINIC_OR_DEPARTMENT_OTHER): Admitting: Cardiology

## 2024-02-25 DIAGNOSIS — G4719 Other hypersomnia: Secondary | ICD-10-CM | POA: Diagnosis not present

## 2024-02-26 ENCOUNTER — Ambulatory Visit: Attending: Cardiology

## 2024-02-26 DIAGNOSIS — G4733 Obstructive sleep apnea (adult) (pediatric): Secondary | ICD-10-CM

## 2024-02-26 NOTE — Procedures (Signed)
   SLEEP STUDY REPORT Patient Information Study Date: 02/25/2024 Patient Name: Ryan Williamson Patient ID: [id number] Birth Date: 1976/01/07 Age: 48 Gender: Male BMI: 37.6 (W=278 lb, H=6' 0'') Referring Physician: Lynwood Schilling, MD  TEST DESCRIPTION: Home sleep apnea testing was completed using the WatchPat, a Type 1 device, utilizing peripheral arterial tonometry (PAT), chest movement, actigraphy, pulse oximetry, pulse rate, body position and snore. AHI was calculated with apnea and hypopnea using valid sleep time as the denominator. RDI includes apneas, hypopneas, and RERAs. The data acquired and the scoring of sleep and all associated events were performed in accordance with the recommended standards and specifications as outlined in the AASM Manual for the Scoring of Sleep and Associated Events 2.2.0 (2015).  FINDINGS: 1. No evidence of Obstructive Sleep Apnea with AHI 2.2/hr. 2. No Central Sleep Apnea. 3. Oxygen desaturations as low as 82%. 4. Mild to moderate snoring was present. O2 sats were < 88% for 0.2 minutes. 5. Total sleep time was 5 hrs and 35 min. 6. 26.8% of total sleep time was spent in REM sleep. 7. Normal sleep onset latency at 16 min. 8. Prolonged REM sleep onset latency at 112 min. 9. Total awakenings were 4.  DIAGNOSIS: Normal study with no significant sleep disordered breathing.  RECOMMENDATIONS: 1. Normal study with no significant sleep disordered breathing. 2. Healthy sleep recommendations include: adequate nightly sleep (normal 7-9 hrs/night), avoidance of caffeine after noon and alcohol near bedtime, and maintaining a sleep environment that is cool, dark and quiet. 3. Weight loss for overweight patients is recommended. 4. Snoring recommendations include: weight loss where appropriate, side sleeping, and avoidance of alcohol before bed. 5. Operation of motor vehicle or dangerous equipment must be avoided when feeling drowsy, excessively sleepy, or mentally  fatigued. 6. An ENT consultation which may be useful for specific causes of and possible treatment of bothersome snoring . 7. Weight loss may be of benefit in reducing the severity of snoring.   Signature: Wilbert Bihari, MD; [facility]; Diplomat, American Board of Sleep Medicine Electronically Signed: 02/26/2024 8:31:03 PM

## 2024-02-27 ENCOUNTER — Telehealth: Payer: Self-pay

## 2024-02-27 NOTE — Telephone Encounter (Signed)
-----   Message from Lynwood Schilling sent at 02/27/2024  8:22 AM EST ----- Please make sure that he knows the study was negative for sleep apnea.  Thanks. ----- Message ----- From: Shlomo Wilbert SAUNDERS, MD Sent: 02/26/2024   8:32 PM EST To: Lynwood Schilling, MD  No OSA

## 2024-03-02 NOTE — Telephone Encounter (Signed)
 Spoke with pt and notified him of sleep study results. Pt verbalized understanding. All questions if any were answered.

## 2024-03-03 ENCOUNTER — Telehealth: Payer: Self-pay | Admitting: *Deleted

## 2024-03-03 NOTE — Telephone Encounter (Signed)
-----   Message from Wilbert Bihari sent at 02/26/2024  8:32 PM EST ----- Please let patient know that sleep study showed no significant sleep apnea.

## 2024-03-03 NOTE — Telephone Encounter (Signed)
 The patient has been notified of the result via mychart.

## 2024-04-23 ENCOUNTER — Other Ambulatory Visit: Payer: Self-pay | Admitting: Cardiology

## 2024-04-30 ENCOUNTER — Ambulatory Visit: Payer: Self-pay | Admitting: Cardiology

## 2024-05-04 NOTE — Telephone Encounter (Signed)
 Letter sent with results 1/19

## 2024-05-15 ENCOUNTER — Other Ambulatory Visit: Payer: Self-pay | Admitting: Nurse Practitioner
